# Patient Record
Sex: Female | Born: 1985 | Race: White | Hispanic: No | Marital: Single | Smoking: Former smoker
Health system: Southern US, Community
[De-identification: ages and names within clinical notes are randomized; demographics above are authoritative.]

## PROBLEM LIST (undated history)

## (undated) DIAGNOSIS — N200 Calculus of kidney: Secondary | ICD-10-CM

## (undated) DIAGNOSIS — R569 Unspecified convulsions: Secondary | ICD-10-CM

## (undated) DIAGNOSIS — K219 Gastro-esophageal reflux disease without esophagitis: Secondary | ICD-10-CM

## (undated) DIAGNOSIS — R87619 Unspecified abnormal cytological findings in specimens from cervix uteri: Secondary | ICD-10-CM

## (undated) HISTORY — DX: Unspecified convulsions: R56.9

## (undated) HISTORY — DX: Gastro-esophageal reflux disease without esophagitis: K21.9

## (undated) HISTORY — DX: Calculus of kidney: N20.0

## (undated) HISTORY — PX: CHOLECYSTECTOMY: SHX55

## (undated) HISTORY — DX: Unspecified abnormal cytological findings in specimens from cervix uteri: R87.619

---
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
## 2007-08-13 ENCOUNTER — Observation Stay: Payer: Self-pay | Admitting: Obstetrics and Gynecology

## 2007-08-15 ENCOUNTER — Observation Stay: Payer: Self-pay

## 2007-08-28 HISTORY — PX: TONSILLECTOMY: SUR1361

## 2007-09-22 ENCOUNTER — Inpatient Hospital Stay: Payer: Self-pay

## 2007-11-11 ENCOUNTER — Emergency Department (HOSPITAL_COMMUNITY): Admission: EM | Admit: 2007-11-11 | Discharge: 2007-11-11 | Payer: Self-pay | Admitting: Emergency Medicine

## 2008-01-13 ENCOUNTER — Ambulatory Visit: Payer: Self-pay | Admitting: Unknown Physician Specialty

## 2009-02-02 ENCOUNTER — Emergency Department: Payer: Self-pay | Admitting: Emergency Medicine

## 2011-04-10 ENCOUNTER — Ambulatory Visit: Payer: Self-pay | Admitting: General Surgery

## 2011-05-21 LAB — DIFFERENTIAL
Basophils Relative: 1
Eosinophils Absolute: 0
Lymphocytes Relative: 19
Lymphs Abs: 1.9
Monocytes Relative: 8
Neutro Abs: 7.2

## 2011-05-21 LAB — URINALYSIS, ROUTINE W REFLEX MICROSCOPIC
Bilirubin Urine: NEGATIVE
Glucose, UA: NEGATIVE
Protein, ur: NEGATIVE
Specific Gravity, Urine: 1.01
Urobilinogen, UA: 1
pH: 6

## 2011-05-21 LAB — CBC
HCT: 33.2 — ABNORMAL LOW
MCHC: 33.2
Platelets: 365
RBC: 4.4
WBC: 10

## 2011-05-21 LAB — T4, FREE: Free T4: 4.43 — ABNORMAL HIGH

## 2011-05-21 LAB — COMPREHENSIVE METABOLIC PANEL
ALT: 32
AST: 24
BUN: 10
Calcium: 9.7
Potassium: 3.8
Total Protein: 6.9

## 2011-05-21 LAB — TSH: TSH: 0.01 — ABNORMAL LOW

## 2011-05-21 LAB — POCT PREGNANCY, URINE: Preg Test, Ur: NEGATIVE

## 2011-05-21 LAB — URINE MICROSCOPIC-ADD ON

## 2011-08-28 HISTORY — PX: OTHER SURGICAL HISTORY: SHX169

## 2011-11-20 ENCOUNTER — Emergency Department: Payer: Self-pay | Admitting: Emergency Medicine

## 2011-11-20 LAB — PREGNANCY, URINE: Pregnancy Test, Urine: NEGATIVE m[IU]/mL

## 2011-11-20 LAB — URINALYSIS, COMPLETE
Glucose,UR: NEGATIVE mg/dL (ref 0–75)
Ketone: NEGATIVE
Leukocyte Esterase: NEGATIVE
Nitrite: NEGATIVE
Ph: 5 (ref 4.5–8.0)
RBC,UR: 1 /HPF (ref 0–5)
Specific Gravity: 1.029 (ref 1.003–1.030)

## 2012-08-27 HISTORY — PX: TUBAL LIGATION: SHX77

## 2013-01-14 ENCOUNTER — Inpatient Hospital Stay: Payer: Self-pay | Admitting: Obstetrics and Gynecology

## 2013-01-14 LAB — CBC WITH DIFFERENTIAL/PLATELET
Eosinophil #: 0.1 10*3/uL (ref 0.0–0.7)
Lymphocyte #: 2.1 10*3/uL (ref 1.0–3.6)
Lymphocyte %: 15.3 %
MCH: 31.8 pg (ref 26.0–34.0)
Monocyte #: 0.7 x10 3/mm (ref 0.2–0.9)
Monocyte %: 4.9 %
Neutrophil #: 10.7 10*3/uL — ABNORMAL HIGH (ref 1.4–6.5)
Neutrophil %: 78.8 %
WBC: 13.6 10*3/uL — ABNORMAL HIGH (ref 3.6–11.0)

## 2013-01-15 LAB — HEMATOCRIT: HCT: 31.7 % — ABNORMAL LOW (ref 35.0–47.0)

## 2013-03-10 ENCOUNTER — Ambulatory Visit: Payer: Self-pay | Admitting: Obstetrics and Gynecology

## 2013-03-17 ENCOUNTER — Ambulatory Visit: Payer: Self-pay | Admitting: Obstetrics and Gynecology

## 2013-03-21 ENCOUNTER — Emergency Department: Payer: Self-pay | Admitting: Emergency Medicine

## 2013-03-21 LAB — CBC WITH DIFFERENTIAL/PLATELET
Basophil %: 0.8 %
Eosinophil %: 2.3 %
HCT: 40 % (ref 35.0–47.0)
HGB: 14.1 g/dL (ref 12.0–16.0)
MCHC: 35.4 g/dL (ref 32.0–36.0)
Monocyte #: 0.7 x10 3/mm (ref 0.2–0.9)
Monocyte %: 12.1 %
Neutrophil #: 3 10*3/uL (ref 1.4–6.5)
Neutrophil %: 54 %
Platelet: 244 10*3/uL (ref 150–440)
WBC: 5.5 10*3/uL (ref 3.6–11.0)

## 2013-03-21 LAB — BASIC METABOLIC PANEL
BUN: 18 mg/dL (ref 7–18)
Calcium, Total: 9.9 mg/dL (ref 8.5–10.1)
Creatinine: 0.94 mg/dL (ref 0.60–1.30)
EGFR (African American): >60
Glucose: 92 mg/dL (ref 65–99)
Potassium: 3.8 mmol/L (ref 3.5–5.1)

## 2014-02-02 ENCOUNTER — Emergency Department: Payer: Self-pay | Admitting: Emergency Medicine

## 2014-02-02 LAB — URINALYSIS, COMPLETE
BILIRUBIN, UR: NEGATIVE
BLOOD: NEGATIVE
GLUCOSE, UR: NEGATIVE mg/dL (ref 0–75)
Ketone: NEGATIVE
Leukocyte Esterase: NEGATIVE
NITRITE: NEGATIVE
Ph: 6 (ref 4.5–8.0)
Protein: NEGATIVE
RBC,UR: <1 /HPF (ref 0–5)
Specific Gravity: 1.02 (ref 1.003–1.030)
Squamous Epithelial: 6
WBC UR: 1 /HPF (ref 0–5)

## 2014-02-02 LAB — TROPONIN I: Troponin-I: <0.02 ng/mL

## 2014-02-02 LAB — COMPREHENSIVE METABOLIC PANEL
ALBUMIN: 4.2 g/dL (ref 3.4–5.0)
ALK PHOS: 87 U/L
ALT: 26 U/L (ref 12–78)
ANION GAP: 5 — AB (ref 7–16)
BUN: 14 mg/dL (ref 7–18)
Bilirubin,Total: 1 mg/dL (ref 0.2–1.0)
CALCIUM: 9.4 mg/dL (ref 8.5–10.1)
CO2: 28 mmol/L (ref 21–32)
CREATININE: 0.67 mg/dL (ref 0.60–1.30)
Chloride: 104 mmol/L (ref 98–107)
EGFR (African American): >60
Glucose: 89 mg/dL (ref 65–99)
OSMOLALITY: 274 (ref 275–301)
Potassium: 4 mmol/L (ref 3.5–5.1)
SGOT(AST): 15 U/L (ref 15–37)
Sodium: 137 mmol/L (ref 136–145)
Total Protein: 7.8 g/dL (ref 6.4–8.2)

## 2014-02-02 LAB — CBC WITH DIFFERENTIAL/PLATELET
BASOS ABS: 0.1 10*3/uL (ref 0.0–0.1)
Basophil %: 0.6 %
EOS ABS: 0.1 10*3/uL (ref 0.0–0.7)
Eosinophil %: 1.3 %
HCT: 45.6 % (ref 35.0–47.0)
HGB: 15.4 g/dL (ref 12.0–16.0)
LYMPHS ABS: 2 10*3/uL (ref 1.0–3.6)
Lymphocyte %: 21.5 %
MCH: 30.8 pg (ref 26.0–34.0)
MCHC: 33.8 g/dL (ref 32.0–36.0)
MCV: 91 fL (ref 80–100)
Monocyte #: 0.7 x10 3/mm (ref 0.2–0.9)
Monocyte %: 7.5 %
NEUTROS ABS: 6.5 10*3/uL (ref 1.4–6.5)
Neutrophil %: 69.1 %
Platelet: 233 10*3/uL (ref 150–440)
RBC: 5 10*6/uL (ref 3.80–5.20)
RDW: 12.8 % (ref 11.5–14.5)
WBC: 9.5 10*3/uL (ref 3.6–11.0)

## 2014-02-04 ENCOUNTER — Encounter: Payer: Self-pay | Admitting: *Deleted

## 2014-02-22 ENCOUNTER — Ambulatory Visit (INDEPENDENT_AMBULATORY_CARE_PROVIDER_SITE_OTHER): Payer: Medicaid Other | Admitting: General Surgery

## 2014-02-22 ENCOUNTER — Encounter: Payer: Self-pay | Admitting: General Surgery

## 2014-02-22 VITALS — BP 120/76 | HR 78 | Resp 12 | Ht 61.0 in | Wt 171.0 lb

## 2014-02-22 DIAGNOSIS — K8020 Calculus of gallbladder without cholecystitis without obstruction: Secondary | ICD-10-CM

## 2014-02-22 NOTE — Progress Notes (Signed)
Patient ID: Sara BrooksLinda A Cuevas, female   DOB: 1985-09-13, 28 y.o.   MRN: [id number]  Chief Complaint  Patient presents with  . Other    Gallbladder evaluation    HPI Sara BrooksLinda A Proby is a 28 y.o. female who presents for an evaluation of her gallbladder. She states for approximately 2 months she has been having abdominal pain and nausea. The pain is described as a sharp stabbing pain that starts at the base of her sternum and radiates bilaterally under her ribs. She states the pain is becoming for frequent that lasts for hours at a time. Spicy and greasy foods make it worse. She has a lot of burping. No fever or chills.   HPI  Past Medical History  Diagnosis Date  . GERD (gastroesophageal reflux disease)   . Kidney stone   . Seizures     Past Surgical History  Procedure Laterality Date  . Tonsillectomy  2009  . Lipoma removal  2013  . Tubal ligation  2014    History reviewed. No pertinent family history.  Social History History  Substance Use Topics  . Smoking status: Never Smoker   . Smokeless tobacco: Not on file  . Alcohol Use: Yes    Allergies  Allergen Reactions  . Phenergan [Promethazine Hcl] Nausea And Vomiting    Current Outpatient Prescriptions  Medication Sig Dispense Refill  . venlafaxine (EFFEXOR) 75 MG tablet Take 75 mg by mouth daily.       No current facility-administered medications for this visit.    Review of Systems Review of Systems  Constitutional: Negative.   Respiratory: Negative.   Cardiovascular: Negative.   Gastrointestinal: Positive for nausea and abdominal pain.    Blood pressure 120/76, pulse 78, resp. rate 12, height 5\' 1"  (1.549 m), weight 171 lb (77.565 kg), last menstrual period 02/02/2014.  Physical Exam Physical Exam  Constitutional: She is oriented to person, place, and time. She appears well-developed and well-nourished.  Eyes: Conjunctivae are normal. No scleral icterus.  Neck: Neck supple. No thyromegaly present.   Cardiovascular: Normal rate, regular rhythm and normal heart sounds.   No murmur heard. Pulmonary/Chest: Effort normal and breath sounds normal.  Abdominal: Soft. Normal appearance and bowel sounds are normal. There is no hepatosplenomegaly. There is no tenderness. No hernia.  Lymphadenopathy:    She has no cervical adenopathy.  Neurological: She is alert and oriented to person, place, and time.  Skin: Skin is warm and dry.    Data Reviewed US-gallstones   Assessment    Symptoms likely from biliary colic. Discussed cholecystectomy with pt. Procedure, reasons, risks and benefits discussed. Pt is agreeable.     Plan    Patient to be scheduled for cholecystectomy surgery. Discussed fully with patient. Patient agreeable.     Patient is scheduled for surgery at [facility] on 03/01/14. She will pre admit by phone on 02/24/14. Patient is aware of date and instructions.    SANKAR,SEEPLAPUTHUR G 02/22/2014, 4:13 PM

## 2014-02-22 NOTE — Patient Instructions (Addendum)
Patient to be scheduled for gallbladder surgery. The patient is aware to call back for any questions or concerns.  Laparoscopic Cholecystectomy Laparoscopic cholecystectomy is surgery to remove the gallbladder. The gallbladder is located in the upper right part of the abdomen, behind the liver. It is a storage sac for bile produced in the liver. Bile aids in the digestion and absorption of fats. Cholecystectomy is often done for inflammation of the gallbladder (cholecystitis). This condition is usually caused by a buildup of gallstones (cholelithiasis) in your gallbladder. Gallstones can block the flow of bile, resulting in inflammation and pain. In severe cases, emergency surgery may be required. When emergency surgery is not required, you will have time to prepare for the procedure. Laparoscopic surgery is an alternative to open surgery. Laparoscopic surgery has a shorter recovery time. Your common bile duct may also need to be examined during the procedure. If stones are found in the common bile duct, they may be removed. LET [facility] CARE PROVIDER KNOW ABOUT:  Any allergies you have.  All medicines you are taking, including vitamins, herbs, eye drops, creams, and over-the-counter medicines.  Previous problems you or members of your family have had with the use of anesthetics.  Any blood disorders you have.  Previous surgeries you have had.  Medical conditions you have. RISKS AND COMPLICATIONS Generally, this is a safe procedure. However, as with any procedure, complications can occur. Possible complications include:  Infection.  Damage to the common bile duct, nerves, arteries, veins, or other internal organs such as the stomach, liver, or intestines.  Bleeding.  A stone may remain in the common bile duct.  A bile leak from the cyst duct that is clipped when your gallbladder is removed.  The need to convert to open surgery, which requires a larger incision in the abdomen. This  may be necessary if your surgeon thinks it is not safe to continue with a laparoscopic procedure. BEFORE THE PROCEDURE  Ask your health care provider about changing or stopping any regular medicines. You will need to stop taking aspirin or blood thinners at least 5 days prior to surgery.  Do not eat or drink anything after midnight the night before surgery.  Let your health care provider know if you develop a cold or other infectious problem before surgery. PROCEDURE   You will be given medicine to make you sleep through the procedure (general anesthetic). A breathing tube will be placed in your mouth.  When you are asleep, your surgeon will make several small cuts (incisions) in your abdomen.  A thin, lighted tube with a tiny camera on the end (laparoscope) is inserted through one of the small incisions. The camera on the laparoscope sends a picture to a TV screen in the operating room. This gives the surgeon a good view inside your abdomen.  A gas will be pumped into your abdomen. This expands your abdomen so that the surgeon has more room to perform the surgery.  Other tools needed for the procedure are inserted through the other incisions. The gallbladder is removed through one of the incisions.  After the removal of your gallbladder, the incisions will be closed with stitches, staples, or skin glue. AFTER THE PROCEDURE  You will be taken to a recovery area where your progress will be checked often.  You may be allowed to go home the same day if your pain is controlled and you can tolerate liquids. Document Released: 08/13/2005 Document Revised: 06/03/2013 Document Reviewed: 03/25/2013 Exit[employer] Patient Information  2015 [employer]. This information is not intended to replace advice given to you by your health care provider. Make sure you discuss any questions you have with your health care provider.   Patient is scheduled for surgery at [facility] on 03/01/14. She will pre admit by  phone on 02/24/14. Patient is aware of date and instructions.

## 2014-02-25 ENCOUNTER — Ambulatory Visit: Payer: Self-pay | Admitting: General Surgery

## 2014-02-25 LAB — HEPATIC FUNCTION PANEL A (ARMC)
ALBUMIN: 4 g/dL (ref 3.4–5.0)
AST: 29 U/L (ref 15–37)
Alkaline Phosphatase: 81 U/L
BILIRUBIN TOTAL: 1.1 mg/dL — AB (ref 0.2–1.0)
Bilirubin, Direct: 0.2 mg/dL (ref 0.00–0.20)
SGPT (ALT): 25 U/L (ref 12–78)
TOTAL PROTEIN: 7.4 g/dL (ref 6.4–8.2)

## 2014-03-01 ENCOUNTER — Ambulatory Visit: Payer: Self-pay | Admitting: General Surgery

## 2014-03-01 ENCOUNTER — Encounter: Payer: Self-pay | Admitting: General Surgery

## 2014-03-01 DIAGNOSIS — K8010 Calculus of gallbladder with chronic cholecystitis without obstruction: Secondary | ICD-10-CM

## 2014-03-04 LAB — PATHOLOGY REPORT

## 2014-03-08 ENCOUNTER — Encounter: Payer: Self-pay | Admitting: General Surgery

## 2014-03-15 ENCOUNTER — Ambulatory Visit: Payer: Medicaid Other | Admitting: General Surgery

## 2014-04-20 ENCOUNTER — Encounter: Payer: Self-pay | Admitting: *Deleted

## 2014-06-28 ENCOUNTER — Encounter: Payer: Self-pay | Admitting: General Surgery

## 2014-12-17 NOTE — Op Note (Signed)
PATIENT NAME:  Sara [name], Sara [name] MR#:  [id number] DATE OF BIRTH:  09/08/1985  DATE OF PROCEDURE:  03/17/2013  PREOPERATIVE DIAGNOSES: Desires permanent surgical sterilization.   POSTOPERATIVE DIAGNOSIS: Desires permanent surgical sterilization.   PROCEDURE PERFORMED: Bilateral tubal ligation.   SURGEON: Florina Oundreas M. Bonney AidStaebler, MD  ANESTHESIA:  General.  ESTIMATED BLOOD LOSS: Minimal.   OPERATIVE FLUIDS: 700 mL of crystalloid.   COMPLICATIONS: None.   FINDINGS: Normal uterus, tubes and ovaries. Good 1 cm knuckle of fallopian tube, bilateral, in mid isthmic portion of the tube.   SPECIMENS REMOVED: None.   CONDITION FOLLOWING PROCEDURE: Stable.   PROCEDURE IN DETAIL: Risks, benefits and alternatives of the procedure were discussed with the patient prior to proceeding to the operating room. The patient was placed under general endotracheal anesthesia, positioned in the dorsal lithotomy position using Allen stirrups. [name] Hulka tenaculum was placed to allow movement of the uterus. Attention was then turned to the abdomen. [name] stab incision was made at the base of the umbilicus, and [name] 5 mm XL trocar was used to gain entry into the peritoneal cavity under direct visualization. Pneumoperitoneum was established and the 8 mm Falope ring trocar was placed suprapubically under direct visualization. The right fallopian tube was identified, walked out to the mid isthmic portion.  The Falope ring was then applied noting [name] good 1 cm blanched knuckle.  The left fallopian tube was similarly walked out to the mid isthmic portion after identifying the fimbria and also had [name] Falope ring applied with about [name] 1 cm knuckle of tube within the Falope ring with good blanching noted. At this point, pneumoperitoneum was evacuated. All trocars were then removed. The 8 mm trocar site was closed with [name] 4-0 Monocryl, and the 5 mm trocar site was dressed with Dermabond. Sponge, needle and instrument counts were correct x 2. The Hulka  tenaculum had been removed. The patient tolerated the procedure well and was taken to the recovery room in stable condition.  ____________________________ Florina OuAndreas M. Bonney AidStaebler, MD ams:sb D: 03/23/2013 08:25:59 ET T: 03/23/2013 08:38:45 ET JOB#: [id number]  cc: Florina OuAndreas M. Bonney AidStaebler, MD, <Dictator>   Lorrene ReidANDREAS M Martha Ellerby MD ELECTRONICALLY SIGNED 04/09/2013 20:28

## 2014-12-18 NOTE — Op Note (Signed)
PATIENT NAME:  Sara [name], Sara [name] MR#:  [id number] DATE OF BIRTH:  Jun 27, 1986  DATE OF PROCEDURE:  03/01/2014.  PREOPERATIVE DIAGNOSIS: Chronic cholecystitis and cholelithiasis.   POSTOPERATIVE DIAGNOSIS: Chronic cholecystitis and cholelithiasis.   OPERATION: Laparoscopy, cholecystectomy, cholangiogram.   SURGEON:  Kathreen CosierS. G. Jamichael Knotts, M.D.   ANESTHESIA: General.   COMPLICATIONS: None.   ESTIMATED BLOOD LOSS: Minimal.   DRAINS: None.   DESCRIPTION OF PROCEDURE: The patient was put to sleep in the supine position on the operating table. The abdomen was prepped and draped out as [name] sterile field. Timeout was performed. [name] small incision was made at the umbilicus and [name] Veress needle with the InnerDyne sleeve positioned in the peritoneal cavity was verified with the hanging drop method. Pneumoperitoneum was obtained and [name] 10-mm port was placed. An epigastric and 2 lateral 5-mm ports were then placed. The gallbladder was covered with some fatty tissue that was adherent to it from the adjoining mesocolon and the omentum. The stomach likewise was tented up by an adhesion. With careful exposure these adhesions were taken down with cautery. The area of the Hartman's pouch was identified and the cystic duct required more careful dissection as there was [name] significant amount of [name] firm scar tissue surrounding it.   [name] small anterior branch of the cystic artery was identified. It was Hemoclipped and cut. Subsequently the cystic duct was freed.  [name] Kumar clamp and [name] catheter were positioned, but it appeared that there was very little in the way of any bile that could be aspirated. In view of this, the catheter was removed. The cystic duct was proximally Hemoclipped and an opening made through which some clear bile was seen coming through.  [name] Reddick catheter was then positioned and cholangiogram was performed. However, since the balloon was partially obstructing the proximal portion of the duct it could not be adequately  filled but the distal portion filled easily with no defects and no obstruction to flow. The catheter was removed. The cystic duct was Hemoclipped and cut.   The cystic artery was identified, freed, Hemoclipped and cut. The gallbladder was then dissected free from its bed using cautery for control of bleeding. After ensuring hemostasis, the area was irrigated and all fluid suctioned out below and lateral to the liver. With 5-mm scope in the epigastric port site the gallbladder was placed in [name] retrieval bag and brought out through the umbilical port site. There were multiple stones packing the gallbladder and the stones had to be removed almost in entirely before the gallbladder could be delivered out of the port site.   The umbilical port site was then closed with 0-Vicryl placed with [name] suture passer, and tied. The right upper quadrant once again was inspected and the remaining small amount of fluid was suctioned out from this area. The pneumoperitoneum was relieved and the ports were removed. Skin incisions were closed with subcuticular 4-0 Vicryl, reinforced with Steri-Strips and dry sterile dressing was placed. The patient tolerated the procedure well with no immediate problems encountered. She was subsequently extubated and returned to the recovery room in stable condition.   ____________________________ S.Wynona LunaG. Paizleigh Wilds, MD sgs:lt D: 03/01/2014 11:18:39 ET T: 03/01/2014 12:07:19 ET JOB#: [id number]  cc: S.G. Evette CristalSankar, MD, <Dictator> [facility] Surgery[facility] Wynona LunaG Marquise Wicke MD ELECTRONICALLY SIGNED 03/03/2014 5:43

## 2015-01-04 NOTE — H&P (Signed)
L&D Evaluation:  History:  HPI 29 yo G3P2002 at 2139 weeks 5770w1d by 8w US derived EDD of 01/20/2013 presenting to clinic today for irregular contractions.  Noted to be 2-3cm dilated unchanged from prior visits.  However, during doppler auscultation of the fetal heart tones note was made of an arythmia.  She was therefore sent to L&D for further monitoring and evaluation.  +FM but decreased in last few days per patient, no VB, no LOF.  [facility] unremarkable to date.  PNL O+ / ABSC neg / RI / VZI / RPR NR / HIV neg / HBsAg neg / 1st trimester screen negative / 1-hr 98 / GC & CT neg & neg / GBS neg.  History of IUGR in two prior pregnancies, US at 30 weeks revealed EFW c/w 46%ile.  Fundal heights have been appropriate.   Presents with fetal arythmia   Patient's Medical History No Chronic Illness   Patient's Surgical History Tonislectomy, removal of fatty tumor groin   Medications Pre Natal Vitamins   Allergies phenergan   Social History none   Family History Non-Contributory   ROS:  ROS All systems were reviewed.  HEENT, CNS, GI, GU, Respiratory, CV, Renal and Musculoskeletal systems were found to be normal.   Exam:  Vital Signs stable   Urine Protein not completed   General no apparent distress   Mental Status clear   Chest clear   Heart normal sinus rhythm, no murmur/gallop/rubs   Abdomen gravid, non-tender   Estimated Fetal Weight Average for gestational age   Fetal Position vtx   Edema no edema   Pelvic 2-3cm per clinic check   Mebranes Intact   FHT normal rate with no decels, reactive category I tracing with frequent audible PAC's though resulting in dropped beats.   Ucx irregular   Impression:  Impression 29 yo G3P2002 at 3970w1d with fetal PAC's   Plan:  Plan EFM/NST, monitor contractions and for cervical change   Comments 1) PAC's - case discussed with Dr. Ellin MayhewBrancazio.  Given normal anatomy scan at 20 weeks unlikely to be secondary to  significant underlying cardiac malformation.  Therefore does not feel fetal echocardiogram is indicated.  Given patient at term, favorable cervix, recommended proceeding with IOL.  Will make pediatrics aware.  2) Fetus - Category I fetal heart tracing, despite audible arythmia able to monitor the fetus suffiently to proceed with IOL and vaginal delivery  3) Disposition - pending delivery will likely begin with pitocin and then AROM   Electronic Signatures: Lorrene ReidStaebler, Zakkiyya Barno M (MD)  (Signed 21-May-14 14:57)  Authored: L&D Evaluation   Last Updated: 21-May-14 14:57 by Lorrene ReidStaebler, Radek Carnero M (MD)

## 2015-02-25 ENCOUNTER — Emergency Department: Payer: Medicaid Other

## 2015-02-25 ENCOUNTER — Emergency Department
Admission: EM | Admit: 2015-02-25 | Discharge: 2015-02-25 | Disposition: A | Discharge status: Home / Self Care | Payer: Medicaid Other | Attending: Emergency Medicine | Admitting: Emergency Medicine

## 2015-02-25 ENCOUNTER — Encounter: Payer: Self-pay | Admitting: Emergency Medicine

## 2015-02-25 DIAGNOSIS — M25512 Pain in left shoulder: Secondary | ICD-10-CM

## 2015-02-25 DIAGNOSIS — Z79899 Other long term (current) drug therapy: Secondary | ICD-10-CM | POA: Insufficient documentation

## 2015-02-25 MED ORDER — METHOCARBAMOL 500 MG PO TABS: 500.0000 mg | ORAL_TABLET | Freq: Three times a day (TID) | ORAL | Status: DC

## 2015-02-25 MED ORDER — ETODOLAC 400 MG PO TABS: 400.0000 mg | ORAL_TABLET | Freq: Two times a day (BID) | ORAL | Status: DC

## 2015-02-25 NOTE — ED Notes (Addendum)
D/c instructions reviewed w/ pt - pt denies any further questions or concerns at present.  Pt instructed to not use alcohol, drive, or operate heavy machinery while take the prescription muscle relaxants as they could make him drowsy - pt verbalized understanding.

## 2015-02-25 NOTE — ED Provider Notes (Signed)
[facility] Emergency Department Provider Note  ____________________________________________  Time seen: Approximately 12:14 PM  I have reviewed the triage vital signs and the nursing notes.   HISTORY  Chief Complaint Shoulder Pain   HPI Sara BrooksLinda A [name] is a 29 y.o. female is here complaining of left shoulder pain that started yesterday. She states she did not injure her shoulder but woke up like this. She has decreased range of motion due to pain. She has not taken any medication for this. She states pain is worsened with range of motion and decreased with holding her arm against her body. She rates her pain as a 7 out of 10. Pain is constant and nonradiating.   Past Medical History  Diagnosis Date  . GERD (gastroesophageal reflux disease)   . Kidney stone   . Seizures     There are no active problems to display for this patient.   Past Surgical History  Procedure Laterality Date  . Tonsillectomy  2009  . Lipoma removal  2013  . Tubal ligation  2014  . Cholecystectomy      Current Outpatient Rx  Name  Route  Sig  Dispense  Refill  . etodolac (LODINE) 400 MG tablet   Oral   Take 1 tablet (400 mg total) by mouth 2 (two) times daily.   20 tablet   0   . methocarbamol (ROBAXIN) 500 MG tablet   Oral   Take 1 tablet (500 mg total) by mouth 3 (three) times daily.   12 tablet   0   . venlafaxine (EFFEXOR) 75 MG tablet   Oral   Take 75 mg by mouth daily.           Allergies Phenergan  History reviewed. No pertinent family history.  Social History History  Substance Use Topics  . Smoking status: Never Smoker   . Smokeless tobacco: Not on file  . Alcohol Use: Yes    Review of Systems Constitutional: No fever/chills Eyes: No visual changes. ENT: No sore throat. Cardiovascular: Denies chest pain. Respiratory: Denies shortness of breath. Gastrointestinal: No abdominal pain.  No nausea, no vomiting. Genitourinary: Negative for  dysuria. Musculoskeletal: Negative for back pain. Skin: Negative for rash. Neurological: Negative for headaches, focal weakness or numbness.  10-point ROS otherwise negative.  ____________________________________________   PHYSICAL EXAM:  VITAL SIGNS: ED Triage Vitals  Enc Vitals Group     BP 02/25/15 1128 95/65 mmHg     Pulse Rate 02/25/15 1128 86     Resp --      Temp 02/25/15 1128 98.1 F (36.7 C)     Temp Source 02/25/15 1128 Oral     SpO2 02/25/15 1128 98 %     Weight 02/25/15 1128 180 lb (81.647 kg)     Height 02/25/15 1128 5\' 1"  (1.549 m)     Head Cir --      Peak Flow --      Pain Score 02/25/15 1123 7     Pain Loc --      Pain Edu? --      Excl. in GC? --     Constitutional: Alert and oriented. Well appearing and in no acute distress. Eyes: Conjunctivae are normal. PERRL. EOMI. Head: Atraumatic. Nose: No congestion/rhinnorhea. Neck: No stridor.  No cervical tenderness on palpation Hematological/Lymphatic/Immunilogical: No cervical lymphadenopathy. Cardiovascular: Normal rate, regular rhythm. Grossly normal heart sounds.  Good peripheral circulation. Respiratory: Normal respiratory effort.  No retractions. Lungs CTAB. Gastrointestinal: Soft and nontender.  No distention. Musculoskeletal: No lower extremity tenderness nor edema.  No joint effusions. Left shoulder no gross deformity noted. Moderate tenderness on palpation of the trapezius and before meals joint area. Range of motion is restricted secondary to pain. No crepitus was noted with range of motion. Skin is normal, without abrasion or ecchymosis. Neurologic:  Normal speech and language. No gross focal neurologic deficits are appreciated. Speech is normal. No gait instability. Skin:  Skin is warm, dry and intact. No rash noted. Psychiatric: Mood and affect are normal. Speech and behavior are normal.  ____________________________________________   LABS (all labs ordered are listed, but only abnormal  results are displayed)  Labs Reviewed - No data to display  RADIOLOGY  X-ray per radiologist shows no bony abnormality of the shoulder. I, Tommi Rumps, personally viewed and evaluated these images as part of my medical decision making.  ____________________________________________   PROCEDURES  Procedure(s) performed: None  Critical Care performed: No  ____________________________________________   INITIAL IMPRESSION / ASSESSMENT AND PLAN / ED COURSE  Pertinent labs & imaging results that were available during my care of the patient were reviewed by me and considered in my medical decision making (see chart for detailspatient was placed in a sling for comfort. She is also given a prescription for etodolac and Robaxin. She is encouraged to use ice or heat to the muscles and to follow-up with her PCP as needed.   FINAL CLINICAL IMPRESSION(S) / ED DIAGNOSES  Final diagnoses:  Acute shoulder pain, left      Tommi Rumps, PA-C 02/25/15 1232  Darien Ramus, MD 02/26/15 2256

## 2015-02-25 NOTE — ED Notes (Signed)
Pt to ed with c/o left shoulder pain that started yesterday.  Pt states she does not remember injuring it.  Pt with decreased ROM in left shoulder due to pain.  No bruising, or swelling noted to left shoulder.

## 2015-12-17 ENCOUNTER — Emergency Department (HOSPITAL_COMMUNITY)
Admission: EM | Admit: 2015-12-17 | Discharge: 2015-12-17 | Disposition: A | Discharge status: Home / Self Care | Payer: Medicaid Other | Attending: Emergency Medicine | Admitting: Emergency Medicine

## 2015-12-17 ENCOUNTER — Encounter (HOSPITAL_COMMUNITY): Payer: Self-pay | Admitting: *Deleted

## 2015-12-17 DIAGNOSIS — R000 Tachycardia, unspecified: Secondary | ICD-10-CM | POA: Diagnosis not present

## 2015-12-17 DIAGNOSIS — J209 Acute bronchitis, unspecified: Secondary | ICD-10-CM | POA: Diagnosis not present

## 2015-12-17 DIAGNOSIS — M791 Myalgia: Secondary | ICD-10-CM | POA: Insufficient documentation

## 2015-12-17 DIAGNOSIS — R51 Headache: Secondary | ICD-10-CM | POA: Diagnosis not present

## 2015-12-17 DIAGNOSIS — Z79899 Other long term (current) drug therapy: Secondary | ICD-10-CM | POA: Insufficient documentation

## 2015-12-17 DIAGNOSIS — Z87442 Personal history of urinary calculi: Secondary | ICD-10-CM | POA: Diagnosis not present

## 2015-12-17 DIAGNOSIS — Z8719 Personal history of other diseases of the digestive system: Secondary | ICD-10-CM | POA: Diagnosis not present

## 2015-12-17 DIAGNOSIS — J029 Acute pharyngitis, unspecified: Secondary | ICD-10-CM | POA: Diagnosis present

## 2015-12-17 DIAGNOSIS — J40 Bronchitis, not specified as acute or chronic: Secondary | ICD-10-CM

## 2015-12-17 MED ORDER — BENZONATATE 100 MG PO CAPS: 100.0000 mg | ORAL_CAPSULE | Freq: Three times a day (TID) | ORAL | Status: DC

## 2015-12-17 MED ORDER — AZITHROMYCIN 250 MG PO TABS: ORAL_TABLET | ORAL | Status: DC

## 2015-12-17 MED ORDER — AZITHROMYCIN 250 MG PO TABS
ORAL_TABLET | ORAL | Status: DC
Start: 2015-12-17 — End: 2019-04-16

## 2015-12-17 NOTE — ED Notes (Signed)
Pt reports having a cough, hoarseness, headache, fever since Thursday. Denies n/v/d.

## 2015-12-17 NOTE — ED Provider Notes (Signed)
CSN: [id number]     Arrival date & time 12/17/15  1252 History   First MD Initiated Contact with Patient 12/17/15 1339     Chief Complaint  Patient presents with  . Sore Throat  . URI     (Consider location/radiation/quality/duration/timing/severity/associated sxs/prior Treatment) Patient is a 30 y.o. female presenting with URI.  URI Presenting symptoms: congestion, cough, fever and sore throat   Severity:  Moderate Onset quality:  Gradual Duration:  3 days Timing:  Constant Progression:  Worsening Chronicity:  New Relieved by:  Nothing Worsened by:  Nothing tried Ineffective treatments:  OTC medications Associated symptoms: headaches and myalgias   Risk factors: sick contacts    Sara [name] is a.29 y.o. female who presents to the ED with cough, congestion, fever, chills and sore throat that started 3 days ago. Temp up to 101.5. Productive cough with green sputum.   Past Medical History  Diagnosis Date  . GERD (gastroesophageal reflux disease)   . Kidney stone   . Seizures [facility](HCC)    Past Surgical History  Procedure Laterality Date  . Tonsillectomy  2009  . Lipoma removal  2013  . Tubal ligation  2014  . Cholecystectomy     History reviewed. No pertinent family history. Social History  Substance Use Topics  . Smoking status: Never Smoker   . Smokeless tobacco: None  . Alcohol Use: Yes   OB History    Gravida Para Term Preterm AB TAB SAB Ectopic Multiple Living   3 3        3       Obstetric Comments   1st Menstrual Cycle: 14 1st Pregnancy:  18     Review of Systems  Constitutional: Positive for fever and chills.  HENT: Positive for congestion and sore throat.   Respiratory: Positive for cough.   Musculoskeletal: Positive for myalgias.  Neurological: Positive for headaches.  all other systems negative    Allergies  Phenergan  Home Medications   Prior to Admission medications   Medication Sig Start Date End Date Taking? Authorizing Provider   azithromycin (ZITHROMAX Z-PAK) 250 MG tablet Take 2 tablets today PO and then one tablet daily until finished. 12/17/15   Hope Orlene OchM Neese, NP  benzonatate (TESSALON) 100 MG capsule Take 1 capsule (100 mg total) by mouth every 8 (eight) hours. 12/17/15   Hope Orlene OchM Neese, NP  etodolac (LODINE) 400 MG tablet Take 1 tablet (400 mg total) by mouth 2 (two) times daily. 02/25/15   Tommi Rumpshonda L Summers, PA-C  methocarbamol (ROBAXIN) 500 MG tablet Take 1 tablet (500 mg total) by mouth 3 (three) times daily. 02/25/15   Tommi Rumpshonda L Summers, PA-C  venlafaxine (EFFEXOR) 75 MG tablet Take 75 mg by mouth daily.    Historical Provider, MD   BP 108/70 mmHg  Pulse 107  Temp(Src) 98.6 F (37 C) (Oral)  Resp 16  Wt 81.251 kg  SpO2 100%  LMP 11/14/2015 Physical Exam  Constitutional: She is oriented to person, place, and time. She appears well-developed and well-nourished.  HENT:  Head: Normocephalic.  Right Ear: Tympanic membrane normal.  Left Ear: Tympanic membrane normal.  Nose: Rhinorrhea present.  Mouth/Throat: Uvula is midline and mucous membranes are normal. Posterior oropharyngeal erythema present.  Eyes: Conjunctivae and EOM are normal. Pupils are equal, round, and reactive to light.  Neck: Normal range of motion. Neck supple.  Cardiovascular: Tachycardia present.   Pulmonary/Chest: Effort normal. No accessory muscle usage. No respiratory distress. She has no wheezes. Rhonchi: occasional.  She has no rales.  Abdominal: Soft. There is no tenderness.  Musculoskeletal: Normal range of motion.  Lymphadenopathy:    She has no cervical adenopathy.  Neurological: She is alert and oriented to person, place, and time. No cranial nerve deficit.  Skin: Skin is warm and dry.  Psychiatric: She has a normal mood and affect. Her behavior is normal.  Nursing note and vitals reviewed.   ED Course  Procedures (including critical care time) Labs Review  MDM  30 y.o. female with cough, fever and sore throat stable for d/c  without respiratory distress and O2 SAT100% on R/A. Will treat for bronchitis and she will f/u with her PCP or return here for worsening symptoms. Discussed with the patient and all questioned fully answered.   Final diagnoses:  Bronchitis        Janne Napoleon, NP 12/18/15 1818  Benjiman Core, MD 12/19/15 2329

## 2016-09-16 ENCOUNTER — Encounter (HOSPITAL_COMMUNITY): Payer: Self-pay | Admitting: Emergency Medicine

## 2016-09-16 ENCOUNTER — Emergency Department (HOSPITAL_COMMUNITY): Payer: Self-pay

## 2016-09-16 ENCOUNTER — Emergency Department (HOSPITAL_COMMUNITY)
Admission: EM | Admit: 2016-09-16 | Discharge: 2016-09-16 | Disposition: A | Discharge status: Home / Self Care | Payer: Self-pay | Attending: Emergency Medicine | Admitting: Emergency Medicine

## 2016-09-16 DIAGNOSIS — F17210 Nicotine dependence, cigarettes, uncomplicated: Secondary | ICD-10-CM | POA: Insufficient documentation

## 2016-09-16 DIAGNOSIS — R109 Unspecified abdominal pain: Secondary | ICD-10-CM | POA: Insufficient documentation

## 2016-09-16 LAB — URINALYSIS, ROUTINE W REFLEX MICROSCOPIC
BILIRUBIN URINE: NEGATIVE
GLUCOSE, UA: NEGATIVE mg/dL
HGB URINE DIPSTICK: NEGATIVE
Ketones, ur: NEGATIVE mg/dL
Leukocytes, UA: NEGATIVE
Nitrite: NEGATIVE
Protein, ur: NEGATIVE mg/dL
SPECIFIC GRAVITY, URINE: 1.028 (ref 1.005–1.030)
pH: 5 (ref 5.0–8.0)

## 2016-09-16 LAB — COMPREHENSIVE METABOLIC PANEL
ALBUMIN: 4.5 g/dL (ref 3.5–5.0)
ALK PHOS: 82 U/L (ref 38–126)
ALT: 63 U/L — AB (ref 14–54)
AST: 50 U/L — AB (ref 15–41)
Anion gap: 9 (ref 5–15)
BUN: 14 mg/dL (ref 6–20)
CALCIUM: 9.4 mg/dL (ref 8.9–10.3)
CO2: 22 mmol/L (ref 22–32)
CREATININE: 0.86 mg/dL (ref 0.44–1.00)
Chloride: 108 mmol/L (ref 101–111)
GFR calc Af Amer: >60 mL/min (ref >60)
GFR calc non Af Amer: >60 mL/min (ref >60)
GLUCOSE: 92 mg/dL (ref 65–99)
Potassium: 4.1 mmol/L (ref 3.5–5.1)
SODIUM: 139 mmol/L (ref 135–145)
Total Bilirubin: 1.2 mg/dL (ref 0.3–1.2)
Total Protein: 7.5 g/dL (ref 6.5–8.1)

## 2016-09-16 LAB — WET PREP, GENITAL
CLUE CELLS WET PREP: NONE SEEN
Sperm: NONE SEEN
Trich, Wet Prep: NONE SEEN
Yeast Wet Prep HPF POC: NONE SEEN

## 2016-09-16 LAB — CBC
HCT: 47.2 % — ABNORMAL HIGH (ref 36.0–46.0)
Hemoglobin: 16.9 g/dL — ABNORMAL HIGH (ref 12.0–15.0)
MCH: 31.8 pg (ref 26.0–34.0)
MCHC: 35.8 g/dL (ref 30.0–36.0)
MCV: 88.9 fL (ref 78.0–100.0)
PLATELETS: 246 10*3/uL (ref 150–400)
RBC: 5.31 MIL/uL — ABNORMAL HIGH (ref 3.87–5.11)
RDW: 14 % (ref 11.5–15.5)
WBC: 7.7 10*3/uL (ref 4.0–10.5)

## 2016-09-16 LAB — I-STAT BETA HCG BLOOD, ED (MC, WL, AP ONLY)

## 2016-09-16 LAB — LIPASE, BLOOD: Lipase: 19 U/L (ref 11–51)

## 2016-09-16 NOTE — ED Provider Notes (Signed)
[facility]-EMERGENCY DEPT Provider Note   CSN: [id number] Arrival date & time: 09/16/16  1138     History   Chief Complaint Chief Complaint  Patient presents with  . Abdominal Pain    HPI Sara [name] is a 31 y.o. female.  HPI Patient with right flank pain intermittently for the past several days. It has been coming and going. Sharp in nature and seems to radiate around to her abdomen and also to her left side slightly. Patient has been having intermittent diarrhea. No fever. No vomiting. No pain burning or urgency with urination. No abnormal vaginal discharge or bleeding. Patient has very distant history of a kidney stone when she was 31 years old. She does not remember what that felt like. Patient has already had cholecystectomy. Past Medical History:  Diagnosis Date  . GERD (gastroesophageal reflux disease)   . Kidney stone   . Seizures (HCC)     There are no active problems to display for this patient.   Past Surgical History:  Procedure Laterality Date  . CHOLECYSTECTOMY    . lipoma removal  2013  . TONSILLECTOMY  2009  . TUBAL LIGATION  2014    OB History    Gravida Para Term Preterm AB Living   3 3       3    SAB TAB Ectopic Multiple Live Births                  Obstetric Comments   1st Menstrual Cycle: 14 1st Pregnancy:  18       Home Medications    Prior to Admission medications   Medication Sig Start Date End Date Taking? Authorizing Provider  azithromycin (ZITHROMAX Z-PAK) 250 MG tablet Take 2 tablets today PO and then one tablet daily until finished. 12/17/15   Hope Orlene OchM Neese, NP  benzonatate (TESSALON) 100 MG capsule Take 1 capsule (100 mg total) by mouth every 8 (eight) hours. 12/17/15   Hope Orlene OchM Neese, NP  etodolac (LODINE) 400 MG tablet Take 1 tablet (400 mg total) by mouth 2 (two) times daily. 02/25/15   Tommi Rumpshonda L Summers, PA-C  methocarbamol (ROBAXIN) 500 MG tablet Take 1 tablet (500 mg total) by mouth 3 (three) times daily. 02/25/15   Tommi Rumpshonda L Summers,  PA-C  venlafaxine (EFFEXOR) 75 MG tablet Take 75 mg by mouth daily.    Historical Provider, MD    Family History History reviewed. No pertinent family history.  Social History Social History  Substance Use Topics  . Smoking status: Current Some Day Smoker    Types: Cigarettes  . Smokeless tobacco: Never Used  . Alcohol use Yes     Allergies   Phenergan [promethazine hcl]   Review of Systems Review of Systems 10 Systems reviewed and are negative for acute change except as noted in the HPI.   Physical Exam Updated Vital Signs BP 109/96   Pulse (!) 121   Temp 98.8 F (37.1 C)   Resp 15   Ht 5\' 1"  (1.549 m)   Wt 183 lb (83 kg)   LMP 09/08/2016   SpO2 99%   BMI 34.58 kg/m   Physical Exam  Constitutional: She is oriented to person, place, and time. She appears well-developed and well-nourished. No distress.  HENT:  Head: Normocephalic and atraumatic.  Eyes: Conjunctivae are normal.  Neck: Neck supple.  Cardiovascular: Normal rate and regular rhythm.   No murmur heard. Pulmonary/Chest: Effort normal and breath sounds normal. No respiratory distress.  Abdominal: Soft.  She exhibits no distension. There is no tenderness. There is no guarding.  Genitourinary: Vagina normal.  Genitourinary Comments: Cervix normal without drainage or discharge. No cervical motion tenderness. Adnexa and uterus nontender.  Musculoskeletal: She exhibits no edema, tenderness or deformity.  Neurological: She is alert and oriented to person, place, and time. She exhibits normal muscle tone. Coordination normal.  Skin: Skin is warm and dry.  Psychiatric: She has a normal mood and affect.  Nursing note and vitals reviewed.    ED Treatments / Results  Labs (all labs ordered are listed, but only abnormal results are displayed) Labs Reviewed  COMPREHENSIVE METABOLIC PANEL - Abnormal; Notable for the following:       Result Value   AST 50 (*)    ALT 63 (*)    All other components within  normal limits  CBC - Abnormal; Notable for the following:    RBC 5.31 (*)    Hemoglobin 16.9 (*)    HCT 47.2 (*)    All other components within normal limits  WET PREP, GENITAL  LIPASE, BLOOD  URINALYSIS, ROUTINE W REFLEX MICROSCOPIC  I-STAT BETA HCG BLOOD, ED ([facility], [facility], [facility] ONLY)  GC/CHLAMYDIA PROBE AMP ([place]) NOT AT [facility]    EKG  EKG Interpretation None       Radiology No results found.  Procedures Procedures (including critical care time)  Medications Ordered in ED Medications - No data to display   Initial Impression / Assessment and Plan / ED Course  I have reviewed the triage vital signs and the nursing notes.  Pertinent labs & imaging results that were available during my care of the patient were reviewed by me and considered in my medical decision making (see chart for details).      Final Clinical Impressions(s) / ED Diagnoses   Final diagnoses:  Right flank pain   The patient has had episodic right flank pain. CT scan and urinalysis are pending. Abdominal examination is nonsurgical, pelvic examination does not reveal tenderness, discharge or friability of the cervix. Back exam is will review CT results for possible kidney stone or other etiology. If no stone or other acute finding is present, I feel patient is safe for discharge with symptomatic treatment with either Tylenol or ibuprofen. New Prescriptions New Prescriptions   No medications on file     Arby Barrette, MD 09/16/16 1620

## 2016-09-16 NOTE — ED Triage Notes (Signed)
Pt states she has been having right flank pain that radiates into abd. Pt has hx of kidney stones and infections. Pt also reports n/v.

## 2016-09-16 NOTE — ED Notes (Signed)
Patient transported to CT 

## 2016-09-16 NOTE — ED Provider Notes (Signed)
Patient care signed out to follow-up CT scan and urinalysis. CT scan unremarkable, urinalysis unremarkable. Prior physician recommended outpatient follow-up.  Results and differential diagnosis were discussed with the patient/parent/guardian. Xrays were independently reviewed by myself.  Close follow up outpatient was discussed, comfortable with the plan.  Ct Renal Stone Study  Result Date: 09/16/2016 CLINICAL DATA:  Evaluate for stones.  Right flank pain. EXAM: CT ABDOMEN AND PELVIS WITHOUT CONTRAST TECHNIQUE: Multidetector CT imaging of the abdomen and pelvis was performed following the standard protocol without IV contrast. COMPARISON:  None. FINDINGS: Lower chest: No acute abnormality. Hepatobiliary: Patient is status post cholecystectomy. The liver is otherwise normal. Pancreas: Unremarkable. No pancreatic ductal dilatation or surrounding inflammatory changes. Spleen: Normal in size without focal abnormality. Adrenals/Urinary Tract: Adrenal glands are unremarkable. Kidneys are normal, without renal calculi, focal lesion, or hydronephrosis. Bladder is unremarkable. Stomach/Bowel: Stomach is within normal limits. Appendix appears normal. No evidence of bowel wall thickening, distention, or inflammatory changes. Vascular/Lymphatic: No significant vascular findings are present. No enlarged abdominal or pelvic lymph nodes. Reproductive: Uterus and bilateral adnexa are unremarkable. Other: No abdominal wall hernia or abnormality. No abdominopelvic ascites. Musculoskeletal: No acute or significant osseous findings. IMPRESSION: 1. No abnormalities to explain the patient's flank pain. The appendix is normal in appearance with no evidence of appendicitis. No renal stones or obstruction. Electronically Signed   By: Gerome Samavid  Williams III M.D   On: 09/16/2016 17:11    Medications - No data to display  Vitals:   09/16/16 1412 09/16/16 1430 09/16/16 1500 09/16/16 1646  BP:  109/96 105/76 107/69  Pulse: 110 (!)  121 101 116  Resp:    16  Temp:      SpO2: 99% 99% 98% 97%  Weight:      Height:        Final diagnoses:  Right flank pain      Blane OharaJoshua Pat Elicker, MD 09/16/16 1728

## 2016-09-16 NOTE — Discharge Instructions (Signed)
If you were given medicines take as directed.  If you are on coumadin or contraceptives realize their levels and effectiveness is altered by many different medicines.  If you have any reaction (rash, tongues swelling, other) to the medicines stop taking and see a physician.    If your blood pressure was elevated in the ER make sure you follow up for management with a primary doctor or return for chest pain, shortness of breath or stroke symptoms.  Please follow up as directed and return to the ER or see a physician for new or worsening symptoms.  Thank you. Vitals:   09/16/16 1412 09/16/16 1430 09/16/16 1500 09/16/16 1646  BP:  109/96 105/76 107/69  Pulse: 110 (!) 121 101 116  Resp:    16  Temp:      SpO2: 99% 99% 98% 97%  Weight:      Height:

## 2016-09-17 LAB — GC/CHLAMYDIA PROBE AMP (~~LOC~~) NOT AT ARMC
Chlamydia: NEGATIVE
NEISSERIA GONORRHEA: NEGATIVE

## 2018-04-05 IMAGING — CT CT RENAL STONE PROTOCOL
2 of 4 series · 16 of 46 positions shown, 18 images · non-contrast
Comparison: None.

CLINICAL DATA: Evaluate for stones.  Right flank pain.

EXAM:
CT ABDOMEN AND PELVIS WITHOUT CONTRAST
TECHNIQUE: Multidetector CT imaging of the abdomen and pelvis was performed
following the standard protocol without IV contrast.

[Series 2: renal stone 5.0 · axial · 0.87mm/px · z∈[+739,+1219]mm · 13 of 106 slices shown, 15 images]
[im 5/106  soft-tissue]
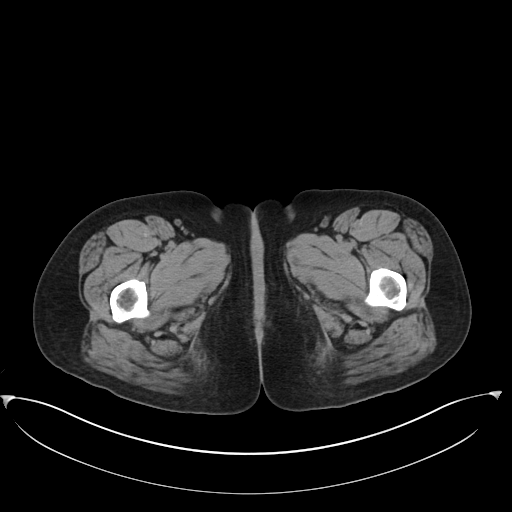
[im 5/106  bone]
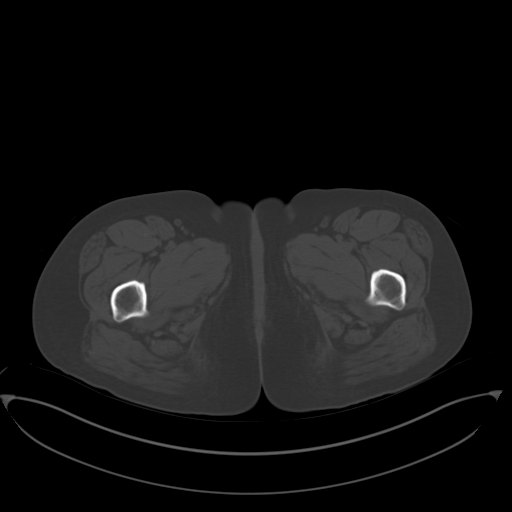
[im 13/106  soft-tissue]
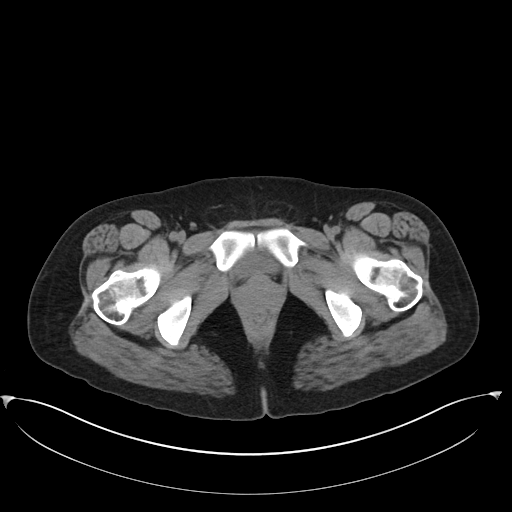
[im 21/106  soft-tissue]
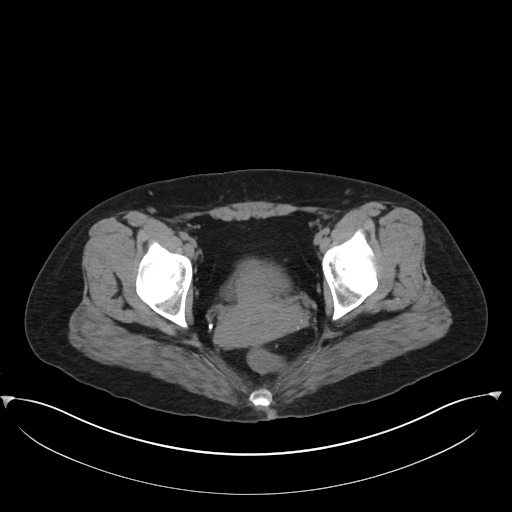
[im 29/106  soft-tissue]
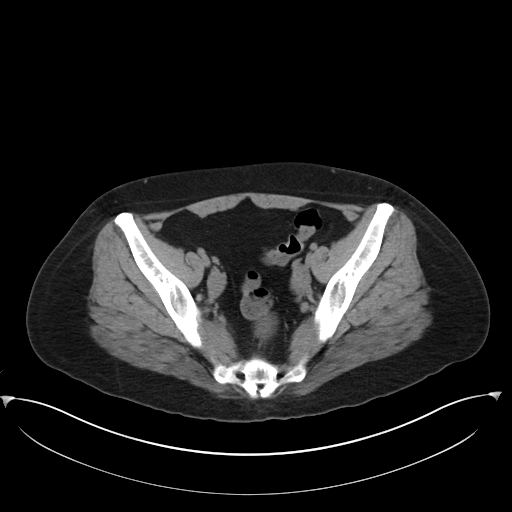
[im 37/106  soft-tissue]
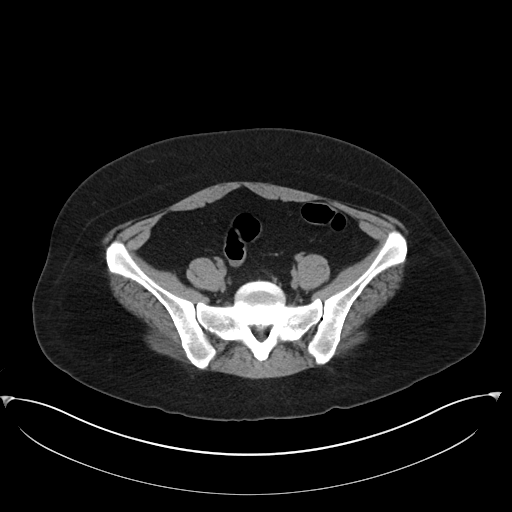
[im 45/106  soft-tissue]
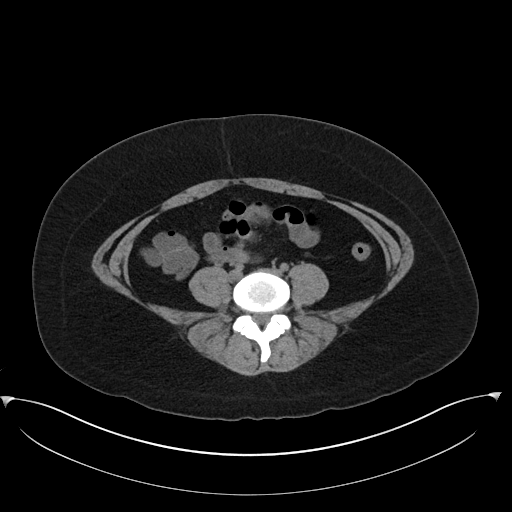
[im 53/106  soft-tissue]
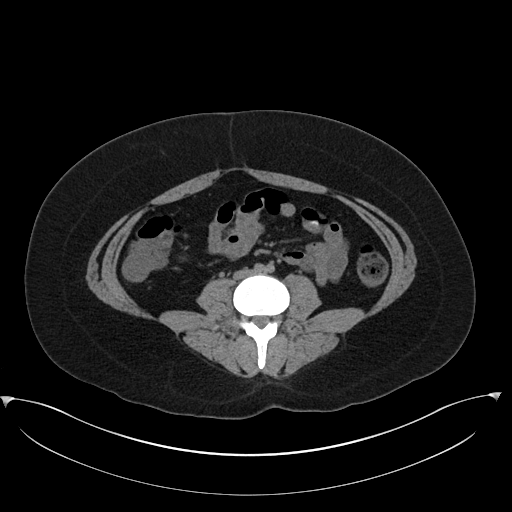
[im 61/106  soft-tissue]
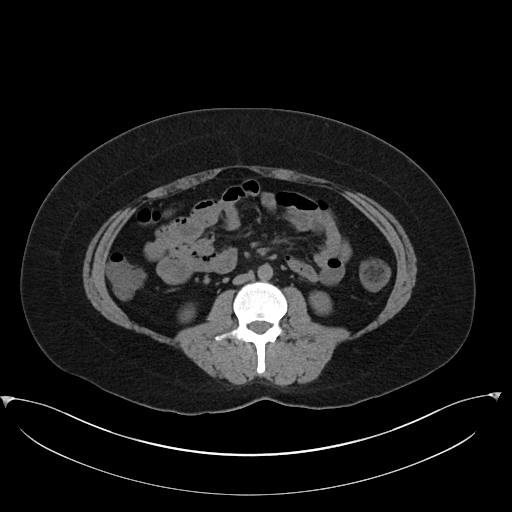
[im 69/106  soft-tissue]
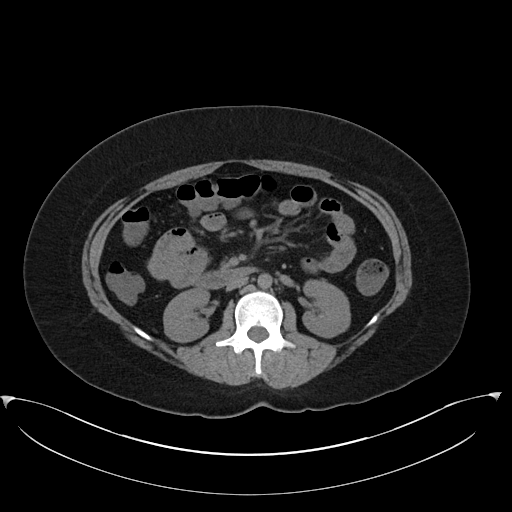
[im 69/106  bone]
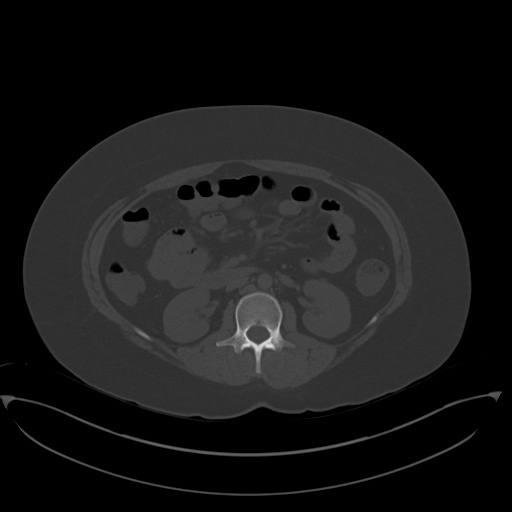
[im 77/106  soft-tissue]
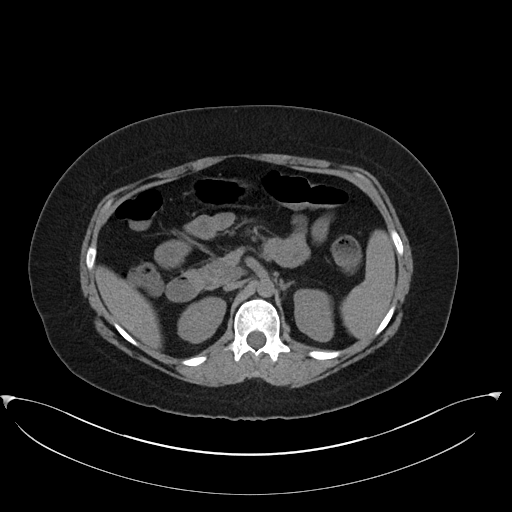
[im 85/106  soft-tissue]
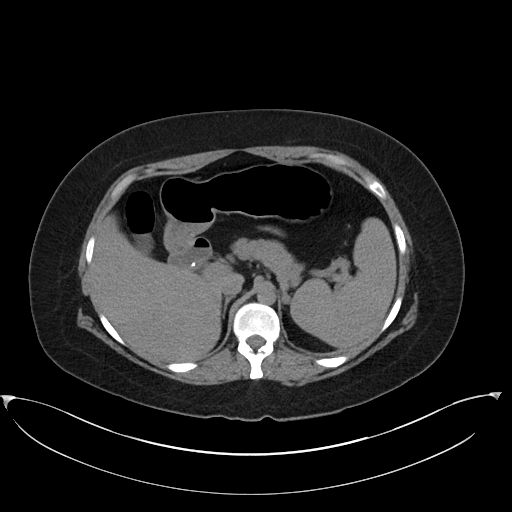
[im 93/106  soft-tissue]
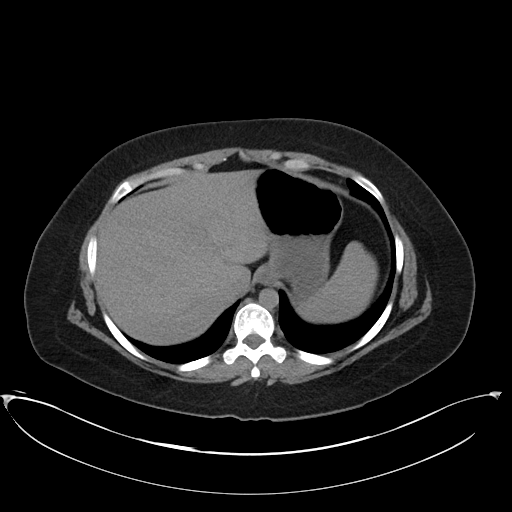
[im 101/106  soft-tissue]
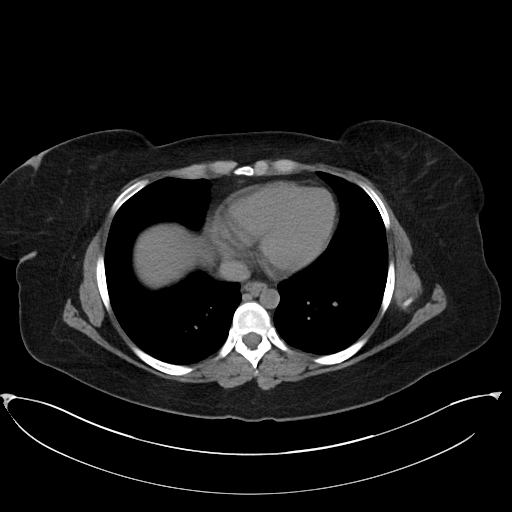

[Series 3: renal stone 3.0 cor · coronal · 0.83mm/px · 3 of 96 slices shown]
[im 32/96  soft-tissue]
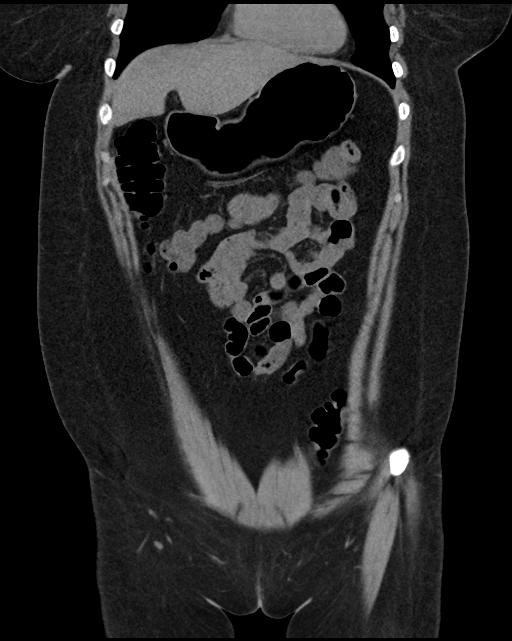
[im 43/96  soft-tissue]
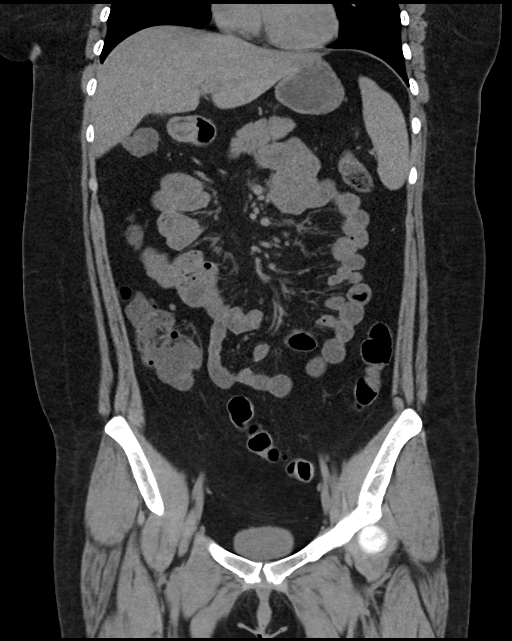
[im 53/96  soft-tissue]
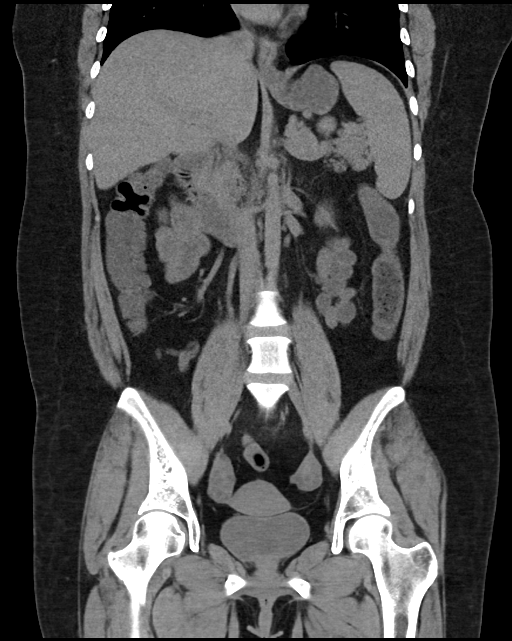

[16 of 46 positions shown; findings below may reference images not displayed]

FINDINGS: Lower chest: No acute abnormality.

Hepatobiliary: Patient is status post cholecystectomy. The liver is
otherwise normal.

Pancreas: Unremarkable. No pancreatic ductal dilatation or
surrounding inflammatory changes.

Spleen: Normal in size without focal abnormality.

Adrenals/Urinary Tract: Adrenal glands are unremarkable. Kidneys are
normal, without renal calculi, focal lesion, or hydronephrosis.
Bladder is unremarkable.

Stomach/Bowel: Stomach is within normal limits. Appendix appears
normal. No evidence of bowel wall thickening, distention, or
inflammatory changes.

Vascular/Lymphatic: No significant vascular findings are present. No
enlarged abdominal or pelvic lymph nodes.

Reproductive: Uterus and bilateral adnexa are unremarkable.

Other: No abdominal wall hernia or abnormality. No abdominopelvic
ascites.

Musculoskeletal: No acute or significant osseous findings.
IMPRESSION: 1. No abnormalities to explain the patient's flank pain. The
appendix is normal in appearance with no evidence of appendicitis.
No renal stones or obstruction.

## 2019-03-27 ENCOUNTER — Telehealth: Payer: Self-pay | Admitting: Obstetrics & Gynecology

## 2019-03-27 NOTE — Telephone Encounter (Signed)
[facility] referring for Atypical Squamous. Called and left voicemail for patient to call back to be schedule

## 2019-04-13 ENCOUNTER — Encounter: Payer: Self-pay | Admitting: Obstetrics and Gynecology

## 2019-04-13 ENCOUNTER — Ambulatory Visit (INDEPENDENT_AMBULATORY_CARE_PROVIDER_SITE_OTHER): Payer: 59 | Admitting: Obstetrics and Gynecology

## 2019-04-13 ENCOUNTER — Other Ambulatory Visit: Payer: Self-pay

## 2019-04-13 VITALS — BP 124/78 | Ht 61.0 in | Wt 135.0 lb

## 2019-04-13 DIAGNOSIS — R87610 Atypical squamous cells of undetermined significance on cytologic smear of cervix (ASC-US): Secondary | ICD-10-CM | POA: Diagnosis not present

## 2019-04-13 NOTE — Progress Notes (Signed)
Obstetrics & Gynecology Office Visit   Chief Complaint:  Chief Complaint  Patient presents with  . Referral    Abnormal pap smear  Referral from  family practice, Dr. Lorelee Market, for an abnormal pap smear  History of Present Illness: 33 y.o. [id number] female who presents in referral from [facility], Dr. Lorelee Market, for an abnormal pap smear.  On March 17, 2019, the patient had a Pap smear that showed atypical squamous cells of undetermined significance.  The HPV result was negative.  She states that she had an abnormal Pap smear when she was 33 years old.  Since that time, she does not recall having an abnormal Pap smear.  She otherwise has no acute issues today and presents only for management recommendations of the above.   Past Medical History:  Diagnosis Date  . Abnormal Pap smear of cervix   . GERD (gastroesophageal reflux disease)   . Kidney stone   . Seizures ([place])     Past Surgical History:  Procedure Laterality Date  . CHOLECYSTECTOMY    . lipoma removal  2013  . TONSILLECTOMY  2009  . TUBAL LIGATION  2014    Gynecologic History: Patient's last menstrual period was 03/30/2019.  Obstetric History: [name]  Family History: denies history of gynecologic cancer   Social History   Socioeconomic History  . Marital status: Single    Spouse name: Not on file  . Number of children: Not on file  . Years of education: Not on file  . Highest education level: Not on file  Occupational History  . Not on file  Social Needs  . Financial resource strain: Not on file  . Food insecurity    Worry: Not on file    Inability: Not on file  . Transportation needs    Medical: Not on file    Non-medical: Not on file  Tobacco Use  . Smoking status: Current Some Day Smoker    Types: Cigarettes  . Smokeless tobacco: Never Used  Substance and Sexual Activity  . Alcohol use: Not Currently  . Drug use: No  . Sexual activity: Yes    Birth  control/protection: Surgical  Lifestyle  . Physical activity    Days per week: Not on file    Minutes per session: Not on file  . Stress: Not on file  Relationships  . Social Herbalist on phone: Not on file    Gets together: Not on file    Attends religious service: Not on file    Active member of club or organization: Not on file    Attends meetings of clubs or organizations: Not on file    Relationship status: Not on file  . Intimate partner violence    Fear of current or ex partner: Not on file    Emotionally abused: Not on file    Physically abused: Not on file    Forced sexual activity: Not on file  Other Topics Concern  . Not on file  Social History Narrative  . Not on file    Allergies  Allergen Reactions  . Phenergan [Promethazine Hcl] Nausea And Vomiting    Prior to Admission medications: denies     Review of Systems  Constitutional: Negative.   HENT: Negative.   Eyes: Negative.   Respiratory: Negative.   Cardiovascular: Negative.   Gastrointestinal: Negative.   Genitourinary: Negative.   Musculoskeletal: Negative.   Skin: Negative.   Neurological: Negative.   Psychiatric/Behavioral: Negative.  Physical Exam BP 124/78   Ht 5\' 1"  (1.549 m)   Wt 135 lb (61.2 kg)   LMP 03/30/2019   BMI 25.51 kg/m  Patient's last menstrual period was 03/30/2019. Physical Exam Constitutional:      General: She is not in acute distress.    Appearance: Normal appearance.  HENT:     Head: Normocephalic and atraumatic.  Eyes:     General: No scleral icterus.    Conjunctiva/sclera: Conjunctivae normal.  Neurological:     General: No focal deficit present.     Mental Status: She is alert and oriented to person, place, and time.     Cranial Nerves: No cranial nerve deficit.  Psychiatric:        Mood and Affect: Mood normal.        Behavior: Behavior normal.        Judgment: Judgment normal.     Assessment: 33 y.o. 153P3003 female here for  1. ASCUS  of cervix with negative high risk HPV      Plan: Problem List Items Addressed This Visit    None    Visit Diagnoses    ASCUS of cervix with negative high risk HPV    -  Primary     Based on the latest guidelines from the ASCCP, the recommended follow-up for this Pap smear result is to repeat a Pap smear with cotesting in 3 years.  It is been my personal practice that patients feel uneasy after having an abnormal Pap smear and will frequently request a Pap smear after only 1 year.  I usually leave this to the discretion of the patient for reassurance.  We did discuss that it would not be necessary.  However, this would not be unreasonable either.  All questions were answered and we discussed the significance of the various components of Pap smear testing in detail so that the patient would have a better understanding of her results.  Thank you for this referral.  If you have any questions or need any guidance for follow-up, please do not hesitate to ask.  20 minutes spent in face to face discussion with > 50% spent in counseling,management, and coordination of care of her ASCUS of cervix with negative high risk HPV.   Thomasene MohairStephen Vasilia Dise, MD 04/13/2019 2:16 PM     CC: Evelene CroonNiemeyer, [place], MD [address] [place],  [place] [id number]

## 2019-04-15 ENCOUNTER — Telehealth: Payer: 59 | Admitting: Nurse Practitioner

## 2019-04-15 DIAGNOSIS — B373 Candidiasis of vulva and vagina: Secondary | ICD-10-CM

## 2019-04-15 DIAGNOSIS — B3731 Acute candidiasis of vulva and vagina: Secondary | ICD-10-CM

## 2019-04-15 MED ORDER — FLUCONAZOLE 150 MG PO TABS: 150.0000 mg | ORAL_TABLET | Freq: Once | ORAL | 0 refills | Status: AC

## 2019-04-15 NOTE — Progress Notes (Signed)

## 2019-04-16 ENCOUNTER — Telehealth: Payer: Self-pay | Admitting: Physician Assistant

## 2019-04-16 DIAGNOSIS — R300 Dysuria: Secondary | ICD-10-CM

## 2019-04-16 MED ORDER — CEPHALEXIN 500 MG PO CAPS: 500.0000 mg | ORAL_CAPSULE | Freq: Two times a day (BID) | ORAL | 0 refills | Status: AC

## 2019-04-16 NOTE — Progress Notes (Signed)
I have spent 5 minutes in review of e-visit questionnaire, review and updating patient chart, medical decision making and response to patient.   Terrah Decoster Cody Juanya Villavicencio, PA-C    

## 2019-04-16 NOTE — Progress Notes (Signed)

## 2019-06-15 ENCOUNTER — Ambulatory Visit: Payer: 59 | Admitting: Obstetrics and Gynecology

## 2019-06-15 ENCOUNTER — Encounter: Payer: Self-pay | Admitting: Obstetrics and Gynecology

## 2019-06-15 ENCOUNTER — Telehealth: Payer: 59 | Admitting: Family

## 2019-06-15 ENCOUNTER — Ambulatory Visit (INDEPENDENT_AMBULATORY_CARE_PROVIDER_SITE_OTHER): Payer: 59 | Admitting: Obstetrics and Gynecology

## 2019-06-15 ENCOUNTER — Other Ambulatory Visit: Payer: Self-pay

## 2019-06-15 VITALS — BP 100/68 | Ht 61.0 in | Wt 138.0 lb

## 2019-06-15 DIAGNOSIS — N898 Other specified noninflammatory disorders of vagina: Secondary | ICD-10-CM

## 2019-06-15 DIAGNOSIS — R300 Dysuria: Secondary | ICD-10-CM

## 2019-06-15 DIAGNOSIS — N3001 Acute cystitis with hematuria: Secondary | ICD-10-CM | POA: Diagnosis not present

## 2019-06-15 LAB — POCT URINALYSIS DIPSTICK
Bilirubin, UA: NEGATIVE
Blood, UA: NEGATIVE
Glucose, UA: NEGATIVE
Ketones, UA: NEGATIVE
Nitrite, UA: NEGATIVE
Protein, UA: NEGATIVE
Spec Grav, UA: 1.025 (ref 1.010–1.025)
pH, UA: 5 (ref 5.0–8.0)

## 2019-06-15 MED ORDER — NITROFURANTOIN MONOHYD MACRO 100 MG PO CAPS: 100.0000 mg | ORAL_CAPSULE | Freq: Two times a day (BID) | ORAL | 0 refills | Status: AC

## 2019-06-15 NOTE — Patient Instructions (Signed)
I value your feedback and entrusting us with your care. If you get a [facility] patient survey, I would appreciate you taking the time to let us know about your experience today. Thank you! 

## 2019-06-15 NOTE — Progress Notes (Signed)
Based on what you shared with me, I feel your condition warrants further evaluation and I recommend that you be seen for a face to face office visit.  Given your symptoms of a UTI with discharge you need to be seen face to face to rule out other serious problems.   NOTE: If you entered your credit card information for this eVisit, you will not be charged. You may see a "hold" on your card for the $35 but that hold will drop off and you will not have a charge processed.  If you are having a true medical emergency please call 911.     For an urgent face to face visit, [place] has four urgent care centers for your convenience:   . [facility]    [phone]                  Get Driving Directions  [address], [place] [address] . 10 am to 8 pm Monday-Friday . 12 pm to 8 pm Saturday-Sunday   . [facility] at [place]                  Get Driving Directions  [address], [place], [place] [address] . 8 am to 8 pm Monday-Friday . 9 am to 6 pm Saturday . 11 am to 6 pm Sunday   . [facility] at [place]                  Get Driving Directions   [address] [place], [place] [address] . 8 am to 8 pm Monday-Friday . 8 am to 4 pm Saturday-Sunday    . [facility] at [place]                    Get Driving Directions  [phone]  [address] [place],  [address]  . Monday-Friday, 12 PM to 6 PM    Your e-visit answers were reviewed by a board certified advanced clinical practitioner to complete your personal care plan.  Thank you for using e-Visits.

## 2019-06-15 NOTE — Progress Notes (Signed)
Sara Market, MD   Chief Complaint  Patient presents with  . Urinary Tract Infection    frequent urination, discomfort when urinating, blood in urine, back pain x since Sat    HPI:      Ms. Sara [name] is a 33 y.o. [id number] who LMP was Patient's last menstrual period was 05/29/2019 (exact date)., presents today for UTI sx of urinary frequency/urgency/dysuria/hematuria for several days. Has had LBP, no pelvic pain/fevers. Hx of UTIs in past. No vag sx, no vag bleeding.    Past Medical History:  Diagnosis Date  . Abnormal Pap smear of cervix   . GERD (gastroesophageal reflux disease)   . Kidney stone   . Seizures ([place])     Past Surgical History:  Procedure Laterality Date  . CHOLECYSTECTOMY    . lipoma removal  2013  . TONSILLECTOMY  2009  . TUBAL LIGATION  2014    History reviewed. No pertinent family history.  Social History   Socioeconomic History  . Marital status: Single    Spouse name: Not on file  . Number of children: Not on file  . Years of education: Not on file  . Highest education level: Not on file  Occupational History  . Not on file  Social Needs  . Financial resource strain: Not on file  . Food insecurity    Worry: Not on file    Inability: Not on file  . Transportation needs    Medical: Not on file    Non-medical: Not on file  Tobacco Use  . Smoking status: Current Some Day Smoker    Types: Cigarettes  . Smokeless tobacco: Never Used  Substance and Sexual Activity  . Alcohol use: Not Currently  . Drug use: No  . Sexual activity: Yes    Birth control/protection: Surgical    Comment: Tubal Ligation  Lifestyle  . Physical activity    Days per week: Not on file    Minutes per session: Not on file  . Stress: Not on file  Relationships  . Social Herbalist on phone: Not on file    Gets together: Not on file    Attends religious service: Not on file    Active member of club or organization: Not on file    Attends  meetings of clubs or organizations: Not on file    Relationship status: Not on file  . Intimate partner violence    Fear of current or ex partner: Not on file    Emotionally abused: Not on file    Physically abused: Not on file    Forced sexual activity: Not on file  Other Topics Concern  . Not on file  Social History Narrative  . Not on file    No outpatient medications prior to visit.   No facility-administered medications prior to visit.       ROS:  Review of Systems  Constitutional: Negative for fever.  Gastrointestinal: Negative for blood in stool, constipation, diarrhea, nausea and vomiting.  Genitourinary: Positive for dysuria, frequency and urgency. Negative for dyspareunia, flank pain, hematuria, vaginal bleeding, vaginal discharge and vaginal pain.  Musculoskeletal: Positive for back pain.  Skin: Negative for rash.  BREAST: No symptoms   OBJECTIVE:   Vitals:  BP 100/68   Ht 5\' 1"  (1.549 m)   Wt 138 lb (62.6 kg)   LMP 05/29/2019 (Exact Date)   BMI 26.07 kg/m   Physical Exam Vitals signs reviewed.  Constitutional:  Appearance: She is well-developed. She is not ill-appearing or toxic-appearing.  Neck:     Musculoskeletal: Normal range of motion.  Pulmonary:     Effort: Pulmonary effort is normal.  Abdominal:     Tenderness: There is no right CVA tenderness or left CVA tenderness.  Musculoskeletal: Normal range of motion.  Neurological:     General: No focal deficit present.     Mental Status: She is alert and oriented to person, place, and time.     Cranial Nerves: No cranial nerve deficit.  Psychiatric:        Behavior: Behavior normal.        Thought Content: Thought content normal.        Judgment: Judgment normal.     Results: Results for orders placed or performed in visit on 06/15/19 (from the past 24 hour(s))  POCT Urinalysis Dipstick     Status: Abnormal   Collection Time: 06/15/19 11:02 AM  Result Value Ref Range   Color, UA  yellow    Clarity, UA cloudy    Glucose, UA Negative Negative   Bilirubin, UA neg    Ketones, UA neg    Spec Grav, UA 1.025 1.010 - 1.025   Blood, UA neg    pH, UA 5.0 5.0 - 8.0   Protein, UA Negative Negative   Urobilinogen, UA     Nitrite, UA neg    Leukocytes, UA Small (1+) (A) Negative   Appearance     Odor       Assessment/Plan: Acute cystitis with hematuria - Plan: nitrofurantoin, macrocrystal-monohydrate, (MACROBID) 100 MG capsule, POCT Urinalysis Dipstick, Urine Culture; Pos sx/UA. Rx macrobid. Check C&S. Fu prn.    Meds ordered this encounter  Medications  . nitrofurantoin, macrocrystal-monohydrate, (MACROBID) 100 MG capsule    Sig: Take 1 capsule (100 mg total) by mouth 2 (two) times daily for 5 days.    Dispense:  10 capsule    Refill:  0    Order Specific Question:   Supervising Provider    Answer:   Nadara Mustard [[id number]]      Return if symptoms worsen or fail to improve.  Alicia B. Copland, PA-C 06/15/2019 11:03 AM

## 2019-06-15 NOTE — Progress Notes (Signed)
See previous Evisit 

## 2019-06-17 LAB — URINE CULTURE

## 2019-06-17 NOTE — Progress Notes (Signed)
Pls let pt know C&S showed UTI. Sometimes responds to macrobid but if still sx, we need to change abx. Pls f/u with pt. Thx.

## 2019-06-18 NOTE — Progress Notes (Signed)
Pt aware. Says she is feeling pretty good now, and will follow up as needed.

## 2019-06-30 ENCOUNTER — Telehealth: Payer: 59 | Admitting: Physician Assistant

## 2019-06-30 DIAGNOSIS — L709 Acne, unspecified: Secondary | ICD-10-CM | POA: Diagnosis not present

## 2019-06-30 MED ORDER — DOXYCYCLINE HYCLATE 100 MG PO TABS: 100.0000 mg | ORAL_TABLET | Freq: Two times a day (BID) | ORAL | 0 refills | Status: DC

## 2019-06-30 MED ORDER — CLINDAMYCIN PHOSPHATE 1 % EX GEL: Freq: Two times a day (BID) | CUTANEOUS | 0 refills | Status: DC

## 2019-06-30 NOTE — Addendum Note (Signed)
Addended by: Chevis Pretty on: 06/30/2019 05:24 PM   Modules accepted: Orders

## 2019-06-30 NOTE — Addendum Note (Signed)
Addended by: Chevis Pretty on: 06/30/2019 04:42 PM   Modules accepted: Orders

## 2019-06-30 NOTE — Progress Notes (Signed)
E-Visit 10 min.  Patient is a 33 year old female who is having problems with acne in the face and scalp.  The patient has tried over-the-counter acne washes, but these have not been successful.  The patient is having some swelling of her face with some redness present.  There is been no high fever reported.  No excessive nausea or vomiting reported.  The patient is asked to continue with the over-the-counter acne washes.  Prescription for doxycycline has been ordered.

## 2019-07-08 ENCOUNTER — Telehealth: Payer: 59 | Admitting: Nurse Practitioner

## 2019-07-08 DIAGNOSIS — B373 Candidiasis of vulva and vagina: Secondary | ICD-10-CM

## 2019-07-08 DIAGNOSIS — R21 Rash and other nonspecific skin eruption: Secondary | ICD-10-CM

## 2019-07-08 DIAGNOSIS — B3731 Acute candidiasis of vulva and vagina: Secondary | ICD-10-CM

## 2019-07-08 MED ORDER — FLUCONAZOLE 150 MG PO TABS: 150.0000 mg | ORAL_TABLET | Freq: Once | ORAL | 0 refills | Status: AC

## 2019-07-08 NOTE — Progress Notes (Signed)
We are sorry that you are not feeling well. Here is how we plan to help! Based on what you shared with me it looks like you: May have a yeast vaginosis  Vaginosis is an inflammation of the vagina that can result in discharge, itching and pain. The cause is usually a change in the normal balance of vaginal bacteria or an infection. Vaginosis can also result from reduced estrogen levels after menopause.  The most common causes of vaginosis are:   Bacterial vaginosis which results from an overgrowth of one on several organisms that are normally present in your vagina.   Yeast infections which are caused by a naturally occurring fungus called candida.   Vaginal atrophy (atrophic vaginosis) which results from the thinning of the vagina from reduced estrogen levels after menopause.   Trichomoniasis which is caused by a parasite and is commonly transmitted by sexual intercourse.  Factors that increase your risk of developing vaginosis include: [place] Kitchen Medications, such as antibiotics and steroids . Uncontrolled diabetes . Use of hygiene products such as bubble bath, vaginal spray or vaginal deodorant . Douching . Wearing damp or tight-fitting clothing . Using an intrauterine device (IUD) for birth control . Hormonal changes, such as those associated with pregnancy, birth control pills or menopause . Sexual activity . Having a sexually transmitted infection  Your treatment plan is A single Diflucan (fluconazole) 150mg  tablet once.  I have electronically sent this prescription into the pharmacy that you have chosen. monistat OTC for the outside itching  Be sure to take all of the medication as directed. Stop taking any medication if you develop a rash, tongue swelling or shortness of breath. Mothers who are breast feeding should consider pumping and discarding their breast milk while on these antibiotics. However, there is no consensus that infant exposure at these doses would be harmful.  Remember  that medication creams can weaken latex condoms. . *according to your questionnaire , you do not have a urinary track  Infection.   HOME CARE:  Good hygiene may prevent some types of vaginosis from recurring and may relieve some symptoms:  . Avoid baths, hot tubs and whirlpool spas. Rinse soap from your outer genital area after a shower, and dry the area well to prevent irritation. Don't use scented or harsh soaps, such as those with deodorant or antibacterial action. Avoid irritants. These include scented tampons and pads. . Wipe from front to back after using the toilet. Doing so avoids spreading fecal bacteria to your vagina.  Other things that may help prevent vaginosis include:  Marland Kitchen Don't douche. Your vagina doesn't require cleansing other than normal bathing. Repetitive douching disrupts the normal organisms that reside in the vagina and can actually increase your risk of vaginal infection. Douching won't clear up a vaginal infection. . Use a latex condom. Both female and female latex condoms may help you avoid infections spread by sexual contact. . Wear cotton underwear. Also wear pantyhose with a cotton crotch. If you feel comfortable without it, skip wearing underwear to bed. Yeast thrives in [place] Kitchen Your symptoms should improve in the next day or two.  GET HELP RIGHT AWAY IF:  . You have pain in your lower abdomen ( pelvic area or over your ovaries) . You develop nausea or vomiting . You develop a fever . Your discharge changes or worsens . You have persistent pain with intercourse . You develop shortness of breath, a rapid pulse, or you faint.  These symptoms could be signs  of problems or infections that need to be evaluated by a medical provider now.  MAKE SURE YOU    Understand these instructions.  Will watch your condition.  Will get help right away if you are not doing well or get worse.  Your e-visit answers were reviewed by a board certified advanced  clinical practitioner to complete your personal care plan. Depending upon the condition, your plan could have included both over the counter or prescription medications. Please review your pharmacy choice to make sure that you have choses a pharmacy that is open for you to pick up any needed prescription, Your safety is important to [place]. If you have drug allergies check your prescription carefully.   You can use MyChart to ask questions about today's visit, request a non-urgent call back, or ask for a work or school excuse for 24 hours related to this e-Visit. If it has been greater than 24 hours you will need to follow up with your provider, or enter a new e-Visit to address those concerns. You will get a MyChart message within the next two days asking about your experience. I hope that your e-visit has been valuable and will speed your recovery.  5-10 minutes spent reviewing and documenting in chart.

## 2019-07-08 NOTE — Progress Notes (Signed)
Based on what you shared with me it looks like you have somethiong going on with your skin ,that should be evaluated in a face to face office visit. I cannot make a definitive diagnosis to know what to give you. It is not yeast. youwill need to see your primary care provider for this.   NOTE: If you entered your credit card information for this eVisit, you will not be charged. You may see a "hold" on your card for the $35 but that hold will drop off and you will not have a charge processed.  If you are having a true medical emergency please call 911.     For an urgent face to face visit, [place] has four urgent care centers for your convenience:   . [facility]    [phone]                  Get Driving Directions  [address], [place] [address] . 10 am to 8 pm Monday-Friday . 12 pm to 8 pm Saturday-Sunday   . [facility] at [place]                  Get Driving Directions  [address], [place], [place] [address] . 8 am to 8 pm Monday-Friday . 9 am to 6 pm Saturday . 11 am to 6 pm Sunday   . [facility] at [place]                  Get Driving Directions   [address] [place], [place] [address] . 8 am to 8 pm Monday-Friday . 8 am to 4 pm Saturday-Sunday    . [facility] at [place]                    Get Driving Directions  [phone]  [address], [place],  [address]  . Monday-Friday, 12 PM to 6 PM    Your e-visit answers were reviewed by a board certified advanced clinical practitioner to complete your personal care plan.  Thank you for using e-Visits.

## 2019-07-16 ENCOUNTER — Telehealth: Payer: 59 | Admitting: Emergency Medicine

## 2019-07-16 DIAGNOSIS — J069 Acute upper respiratory infection, unspecified: Secondary | ICD-10-CM

## 2019-07-16 MED ORDER — METHYLPREDNISOLONE 4 MG PO TBPK: ORAL_TABLET | ORAL | 0 refills | Status: DC

## 2019-07-16 MED ORDER — BENZONATATE 100 MG PO CAPS: 100.0000 mg | ORAL_CAPSULE | Freq: Three times a day (TID) | ORAL | 0 refills | Status: DC | PRN

## 2019-07-16 NOTE — Progress Notes (Signed)
We are sorry you are not feeling well.  Here is how we plan to help!  Based on what you have shared with me, it looks like you may have a viral upper respiratory infection.  Upper respiratory infections are caused by a large number of viruses; however, rhinovirus is the most common cause.  Viral infections cannot be treated with an antibiotic. Zpacks have an antiinflammatory effect that makes coughs feel better therefore I will prescribe medications to calm your lung downs, but 3 days of cough does not warrant use of antibiotics.  Symptoms vary from person to person, with common symptoms including sore throat, cough, fatigue or lack of energy and feeling of general discomfort.  A low-grade fever of up to 100.4 may present, but is often uncommon.  Symptoms vary however, and are closely related to a person's age or underlying illnesses.  The most common symptoms associated with an upper respiratory infection are nasal discharge or congestion, cough, sneezing, headache and pressure in the ears and face.  These symptoms usually persist for about 3 to 10 days, but can last up to 2 weeks.  It is important to know that upper respiratory infections do not cause serious illness or complications in most cases.    Upper respiratory infections can be transmitted from person to person, with the most common method of transmission being a person's hands.  The virus is able to live on the skin and can infect other persons for up to 2 hours after direct contact.  Also, these can be transmitted when someone coughs or sneezes; thus, it is important to cover the mouth to reduce this risk.  To keep the spread of the illness at [place], good hand hygiene is very important.  This is an infection that is most likely caused by a virus. There are no specific treatments other than to help you with the symptoms until the infection runs its course.  We are sorry you are not feeling well.  Here is how we plan to help!   For nasal  congestion, you may use an oral decongestants such as Mucinex D or if you have glaucoma or high blood pressure use plain Mucinex.  Saline nasal spray or nasal drops can help and can safely be used as often as needed for congestion.   If you do not have a history of heart disease, hypertension, diabetes or thyroid disease, prostate/bladder issues or glaucoma, you may also use Sudafed to treat nasal congestion.  It is highly recommended that you consult with a pharmacist or your primary care physician to ensure this medication is safe for you to take.     If you have a cough, you may use cough suppressants such as Delsym and Robitussin.  If you have glaucoma or high blood pressure, you can also use Coricidin HBP.   For cough I have prescribed for you A prescription cough medication called Tessalon Perles 100 mg. You may take 1-2 capsules every 8 hours as needed for cough and Medrol Dosepack- use as directed for 5 days.  If you have a sore or scratchy throat, use a saltwater gargle-  to  teaspoon of salt dissolved in a 4-ounce to 8-ounce glass of warm water.  Gargle the solution for approximately 15-30 seconds and then spit.  It is important not to swallow the solution.  You can also use throat lozenges/cough drops and Chloraseptic spray to help with throat pain or discomfort.  Warm or cold liquids can also be helpful  in relieving throat pain.  For headache, pain or general discomfort, you can use Ibuprofen or Tylenol as directed.   Some authorities believe that zinc sprays or the use of Echinacea may shorten the course of your symptoms.   HOME CARE . Only take medications as instructed by your medical team. . Be sure to drink plenty of fluids. Water is fine as well as fruit juices, sodas and electrolyte beverages. You may want to stay away from caffeine or alcohol. If you are nauseated, try taking small sips of liquids. How do you know if you are getting enough fluid? Your urine should be a pale yellow  or almost colorless. . Get rest. . Taking a steamy shower or using a humidifier may help nasal congestion and ease sore throat pain. You can place a towel over your head and breathe in the steam from hot water coming from a faucet. . Using a saline nasal spray works much the same way. . Cough drops, hard candies and sore throat lozenges may ease your cough. . Avoid close contacts especially the very young and the elderly . Cover your mouth if you cough or sneeze . Always remember to wash your hands.   GET HELP RIGHT AWAY IF: . You develop worsening fever. . If your symptoms do not improve within 10 days . You develop yellow or green discharge from your nose over 3 days. . You have coughing fits . You develop a severe head ache or visual changes. . You develop shortness of breath, difficulty breathing or start having chest pain . Your symptoms persist after you have completed your treatment plan  MAKE SURE YOU   Understand these instructions.  Will watch your condition.  Will get help right away if you are not doing well or get worse.  Your e-visit answers were reviewed by a board certified advanced clinical practitioner to complete your personal care plan. Depending upon the condition, your plan could have included both over the counter or prescription medications. Please review your pharmacy choice. If there is a problem, you may call our nursing hot line at and have the prescription routed to another pharmacy. Your safety is important to [place]. If you have drug allergies check your prescription carefully.   You can use MyChart to ask questions about today's visit, request a non-urgent call back, or ask for a work or school excuse for 24 hours related to this e-Visit. If it has been greater than 24 hours you will need to follow up with your provider, or enter a new e-Visit to address those concerns. You will get an e-mail in the next two days asking about your experience.  I hope that  your e-visit has been valuable and will speed your recovery. Thank you for using e-visits.  Greater than 5 but less than 10 minutes spent researching, coordinating, and implementing care for this patient today

## 2019-07-22 ENCOUNTER — Telehealth: Payer: 59 | Admitting: Family

## 2019-07-22 DIAGNOSIS — R0602 Shortness of breath: Secondary | ICD-10-CM

## 2019-07-22 DIAGNOSIS — R059 Cough, unspecified: Secondary | ICD-10-CM

## 2019-07-22 DIAGNOSIS — R05 Cough: Secondary | ICD-10-CM

## 2019-07-22 NOTE — Progress Notes (Signed)
Based on what you shared with me, I feel your condition warrants further evaluation and I recommend that you be seen for a face to face office visit.see I   Given that you are having a call with shortness of breath you need to be seen face-to-face.   NOTE: If you entered your credit card information for this eVisit, you will not be charged. You may see a "hold" on your card for the $35 but that hold will drop off and you will not have a charge processed.   If you are having a true medical emergency please call 911.      For an urgent face to face visit, [place] has five urgent care centers for your convenience:      NEW:  [facility] at  Get Driving Directions [phone] [place], [place] [address] . 10 am - 6pm Monday - Friday    [facility]) Get Driving Directions [phone] [address] [place], [place] [address] . 10 am to 8 pm Monday-Friday . 12 pm to 8 pm [facility] at [facility] Get Driving Directions [phone] [address], [place], [place] [address] . 8 am to 8 pm Monday-Friday . 9 am to 6 pm Saturday . 11 am to 6 pm Sunday     [facility] at [facility] Get Driving Directions  [phone] [address] [place], [place] [address] . 8 am to 8 pm Monday-Friday . 8 am to 4 pm [facility] at [place] Get Driving Directions [phone] [address] [place], [place] [address] . 12 pm to 6 pm Monday-Friday      Your e-visit answers were reviewed by a board certified advanced clinical practitioner to complete your personal care plan.  Thank you for using e-Visits.

## 2019-08-15 ENCOUNTER — Other Ambulatory Visit: Payer: Self-pay | Admitting: Nurse Practitioner

## 2019-09-18 ENCOUNTER — Telehealth: Payer: 59 | Admitting: Physician Assistant

## 2019-09-18 ENCOUNTER — Other Ambulatory Visit (INDEPENDENT_AMBULATORY_CARE_PROVIDER_SITE_OTHER): Payer: Self-pay | Admitting: Nurse Practitioner

## 2019-09-18 DIAGNOSIS — L709 Acne, unspecified: Secondary | ICD-10-CM

## 2019-09-18 MED ORDER — DOXYCYCLINE HYCLATE 100 MG PO TABS: 100.0000 mg | ORAL_TABLET | Freq: Two times a day (BID) | ORAL | 0 refills | Status: DC

## 2019-09-18 NOTE — Progress Notes (Signed)
  We are sorry that you are experiencing this issue.  Here is how we plan to help!  Based on what you shared with me it looks like you have uncomplicated acne.  Acne is a disorder of the hair follicles and oil glands (sebaceous glands). The sebaceous glands secrete oils to keep the skin moist.  When the glands get clogged, it can lead to pimples or cysts.  These cysts may become infected and leave scars. Acne is very common and normally occurs at puberty.  Acne is also inherited.  Your personal care plan consists of the following recommendations:  I recommend that you use a daily cleanser  You might try an over the counter cleanser that has benzoyl peroxide.  I recommend that you start with a product that has 2.5% benzoyl peroxide.  Stronger concentrations have not been shown to be more effective.    I have also prescribed one of the following additional therapies:  Doxycycline an oral antibiotic 100 mg twice a day for 7 days.   If excessive dryness or peeling occurs, reduce dose frequency or concentration of the topical scrubs.  If excessive stinging or burning occurs, remove the topical gel with mild soap and water and resume at a lower dose the next day.  Remember oral antibiotics and topical acne treatments may increase your sensitivity to the sun!  HOME CARE:  Do not squeeze pimples because that can often lead to infections, worse acne, and scars.  Use a moisturizer that contains retinoid or fruit acids that may inhibit the development of new acne lesions.  Although there is not a clear link that foods can cause acne, doctors do believe that too many sweets predispose you to skin problems.  GET HELP RIGHT AWAY IF:  If your acne gets worse or is not better within 10 days.  If you become depressed.  If you become pregnant, discontinue medications and call your OB/GYN.  MAKE SURE YOU:  Understand these instructions.  Will watch your condition.  Will get help right away if  you are not doing well or get worse.   Your e-visit answers were reviewed by a board certified advanced clinical practitioner to complete your personal care plan.  Depending upon the condition, your plan could have included both over the counter or prescription medications.  Please review your pharmacy choice.  If there is a problem, you may contact your provider through [employer] and have the prescription routed to another pharmacy.  Your safety is important to [place].  If you have drug allergies check your prescription carefully.  For the next 24 hours you can use MyChart to ask questions about today's visit, request a non-urgent call back, or ask for a work or school excuse from your e-visit provider.  You will get an email in the next two days asking about your experience. I hope that your e-visit has been valuable and will speed your recovery.  I spent 5-10 minutes on review and completion of this note- [facility]

## 2019-09-23 ENCOUNTER — Telehealth: Payer: 59 | Admitting: Family

## 2019-09-23 DIAGNOSIS — H109 Unspecified conjunctivitis: Secondary | ICD-10-CM

## 2019-09-23 MED ORDER — POLYMYXIN B-TRIMETHOPRIM 10000-0.1 UNIT/ML-% OP SOLN: 1.0000 [drp] | Freq: Four times a day (QID) | OPHTHALMIC | 0 refills | Status: DC

## 2019-09-23 NOTE — Progress Notes (Signed)
E-Visit for Pink Eye   We are sorry that you are not feeling well.  Here is how we plan to help!  Based on what you have shared with me it looks like you have conjunctivitis.  Conjunctivitis is a common inflammatory or infectious condition of the eye that is often referred to as "pink eye".  In most cases it is contagious (viral or bacterial). However, not all conjunctivitis requires antibiotics (ex. Allergic).  We have made appropriate suggestions for you based upon your presentation.  I have prescribed Polytrim Ophthalmic drops 1-2 drops 4 times a day times 5 days  Pink eye can be highly contagious.  It is typically spread through direct contact with secretions, or contaminated objects or surfaces that one may have touched.  Strict handwashing is suggested with soap and water is urged.  If not available, use alcohol based had sanitizer.  Avoid unnecessary touching of the eye.  If you wear contact lenses, you will need to refrain from wearing them until you see no white discharge from the eye for at least 24 hours after being on medication.  You should see symptom improvement in 1-2 days after starting the medication regimen.  Call us if symptoms are not improved in 1-2 days.  Home Care:  Wash your hands often!  Do not wear your contacts until you complete your treatment plan.  Avoid sharing towels, bed linen, personal items with a person who has pink eye.  See attention for anyone in your home with similar symptoms.  Get Help Right Away If:  Your symptoms do not improve.  You develop blurred or loss of vision.  Your symptoms worsen (increased discharge, pain or redness)  Your e-visit answers were reviewed by a board certified advanced clinical practitioner to complete your personal care plan.  Depending on the condition, your plan could have included both over the counter or prescription medications.  If there is a problem please reply  once you have received a response from your  provider.  Your safety is important to us.  If you have drug allergies check your prescription carefully.    You can use MyChart to ask questions about today's visit, request a non-urgent call back, or ask for a work or school excuse for 24 hours related to this e-Visit. If it has been greater than 24 hours you will need to follow up with your provider, or enter a new e-Visit to address those concerns.   You will get an e-mail in the next two days asking about your experience.  I hope that your e-visit has been valuable and will speed your recovery. Thank you for using e-visits.  Approximately 5 minutes was spent documenting and reviewing patient's chart.      

## 2019-11-12 ENCOUNTER — Telehealth: Payer: 59 | Admitting: Nurse Practitioner

## 2019-11-12 DIAGNOSIS — N3000 Acute cystitis without hematuria: Secondary | ICD-10-CM

## 2019-11-12 MED ORDER — SULFAMETHOXAZOLE-TRIMETHOPRIM 800-160 MG PO TABS: 1.0000 | ORAL_TABLET | Freq: Two times a day (BID) | ORAL | 0 refills | Status: DC

## 2019-11-12 NOTE — Progress Notes (Signed)

## 2020-02-24 ENCOUNTER — Telehealth: Payer: 59 | Admitting: Nurse Practitioner

## 2020-02-24 DIAGNOSIS — L249 Irritant contact dermatitis, unspecified cause: Secondary | ICD-10-CM | POA: Diagnosis not present

## 2020-02-24 NOTE — Progress Notes (Signed)
E Visit for Rash  We are sorry that you are not feeling well. Here is how we plan to help!  Based on what you shared with me it looks like you have contact dermatitis.  Contact dermatitis is a skin rash caused by something that touches the skin and causes irritation or inflammation.  Your skin may be red, swollen, dry, cracked, and itch.  The rash should go away in a few days but can last a few weeks.  If you get a rash, it's important to figure out what caused it so the irritant can be avoided in the future. and I have prescribed Prednisone 20 mg 2 at sametime daily for 5 days.    HOME CARE:   Take cool showers and avoid direct sunlight.  Apply cool compress or wet dressings.  Take a bath in an oatmeal bath.  Sprinkle content of one Aveeno packet under running faucet with comfortably warm water.  Bathe for 15-20 minutes, 1-2 times daily.  Pat dry with a towel. Do not rub the rash.  Use hydrocortisone cream.  Take an antihistamine like Benadryl for widespread rashes that itch.  The adult dose of Benadryl is 25-50 mg by mouth 4 times daily.  Caution:  This type of medication may cause sleepiness.  Do not drink alcohol, drive, or operate dangerous machinery while taking antihistamines.  Do not take these medications if you have prostate enlargement.  Read package instructions thoroughly on all medications that you take.  GET HELP RIGHT AWAY IF:   Symptoms don't go away after treatment.  Severe itching that persists.  If you rash spreads or swells.  If you rash begins to smell.  If it blisters and opens or develops a yellow-brown crust.  You develop a fever.  You have a sore throat.  You become short of breath.  MAKE SURE YOU:  Understand these instructions. Will watch your condition. Will get help right away if you are not doing well or get worse.  Thank you for choosing an e-visit. Your e-visit answers were reviewed by a board certified advanced clinical practitioner  to complete your personal care plan. Depending upon the condition, your plan could have included both over the counter or prescription medications. Please review your pharmacy choice. Be sure that the pharmacy you have chosen is open so that you can pick up your prescription now.  If there is a problem you may message your provider in MyChart to have the prescription routed to another pharmacy. Your safety is important to [place]. If you have drug allergies check your prescription carefully.  For the next 24 hours, you can use MyChart to ask questions about today's visit, request a non-urgent call back, or ask for a work or school excuse from your e-visit provider. You will get an email in the next two days asking about your experience. I hope that your e-visit has been valuable and will speed your recovery.  5-10 minutes spent reviewing and documenting in chart.

## 2020-04-01 ENCOUNTER — Other Ambulatory Visit: Payer: 59

## 2020-04-01 ENCOUNTER — Other Ambulatory Visit: Payer: Self-pay | Admitting: Obstetrics & Gynecology

## 2020-04-01 DIAGNOSIS — N926 Irregular menstruation, unspecified: Secondary | ICD-10-CM

## 2020-04-27 ENCOUNTER — Other Ambulatory Visit: Payer: Self-pay | Admitting: Obstetrics & Gynecology

## 2020-04-27 DIAGNOSIS — N926 Irregular menstruation, unspecified: Secondary | ICD-10-CM

## 2020-05-25 ENCOUNTER — Telehealth: Payer: Medicaid Other | Admitting: Family

## 2020-05-25 DIAGNOSIS — R110 Nausea: Secondary | ICD-10-CM

## 2020-05-25 MED ORDER — ONDANSETRON HCL 4 MG PO TABS: 4.0000 mg | ORAL_TABLET | Freq: Three times a day (TID) | ORAL | 0 refills | Status: DC | PRN

## 2020-05-25 NOTE — Progress Notes (Signed)
We are sorry that you are not feeling well. Here is how we plan to help!  Based on what you have shared with me it looks like you have nausea.  When we treat short term symptoms, we always caution that any symptoms that persist should be fully evaluated in a medical office.  I have prescribed a medication that will help alleviate your symptoms and allow you to stay hydrated:  Zofran 4 mg 1 tablet every 8 hours as needed for nausea and vomiting  HOME CARE:  Drink clear liquids.  This is very important! Dehydration (the lack of fluid) can lead to a serious complication.  Start off with 1 tablespoon every 5 minutes for 8 hours.  You may begin eating bland foods after 8 hours without vomiting.  Start with saltine crackers, white bread, rice, mashed potatoes, applesauce.  After 48 hours on a bland diet, you may resume a normal diet.  Try to go to sleep.  Sleep often empties the stomach and relieves the need to vomit.  GET HELP RIGHT AWAY IF:   Your symptoms do not improve or worsen within 2 days after treatment.  You have a fever for over 3 days.  You cannot keep down fluids after trying the medication.  MAKE SURE YOU:   Understand these instructions.  Will watch your condition.  Will get help right away if you are not doing well or get worse.   Thank you for choosing an e-visit. Your e-visit answers were reviewed by a board certified advanced clinical practitioner to complete your personal care plan. Depending upon the condition, your plan could have included both over the counter or prescription medications. Please review your pharmacy choice. Be sure that the pharmacy you have chosen is open so that you can pick up your prescription now.  If there is a problem you may message your provider in MyChart to have the prescription routed to another pharmacy. Your safety is important to [place]. If you have drug allergies check your prescription carefully.  For the next 24 hours, you can  use MyChart to ask questions about today's visit, request a non-urgent call back, or ask for a work or school excuse from your e-visit provider. You will get an e-mail in the next two days asking about your experience. I hope that your e-visit has been valuable and will speed your recovery.  Approximately 5 minutes was spent documenting and reviewing patient's chart.

## 2020-06-07 ENCOUNTER — Ambulatory Visit: Payer: Medicaid Other | Admitting: Obstetrics and Gynecology

## 2020-06-07 NOTE — Progress Notes (Deleted)
PCP:  Evelene Croon, MD   No chief complaint on file.    HPI:      Sara [name] is a 34 y.o. [phone] whose LMP was No LMP recorded., presents today for her annual examination.  Her menses are {norm/abn:715}, lasting {number:22536} days.  Dysmenorrhea {dysmen:716}. She {does:18564} have intermenstrual bleeding.  Sex activity: {sex active:315163}.  Last Pap: 03/15/20  Results were: ASCUS with NEGATIVE high risk HPV ; repeat due after 3 yrs but will do today Hx of STDs: {STD hx:14358}  There is no FH of breast cancer. There is no FH of ovarian cancer. The patient {does:18564} do self-breast exams.  Tobacco use: {tob:20664} Alcohol use: {Alcohol:11675} No drug use.  Exercise: {exercise:31265}  She {does:18564} get adequate calcium and Vitamin D in her diet.   The pregnancy intention screening data noted above was reviewed. Potential methods of contraception were discussed. The patient elected to proceed with {Upstream End Methods:24109}.     Past Medical History:  Diagnosis Date  . Abnormal Pap smear of cervix   . GERD (gastroesophageal reflux disease)   . Kidney stone   . Seizures (HCC)     Past Surgical History:  Procedure Laterality Date  . CHOLECYSTECTOMY    . lipoma removal  2013  . TONSILLECTOMY  2009  . TUBAL LIGATION  2014    No family history on file.  Social History   Socioeconomic History  . Marital status: Single    Spouse name: Not on file  . Number of children: Not on file  . Years of education: Not on file  . Highest education level: Not on file  Occupational History  . Not on file  Tobacco Use  . Smoking status: Current Some Day Smoker    Types: Cigarettes  . Smokeless tobacco: Never Used  Vaping Use  . Vaping Use: Never used  Substance and Sexual Activity  . Alcohol use: Not Currently  . Drug use: No  . Sexual activity: Yes    Birth control/protection: Surgical    Comment: Tubal Ligation  Other Topics Concern  . Not on  file  Social History Narrative  . Not on file   Social Determinants of Health   Financial Resource Strain:   . Difficulty of Paying Living Expenses: Not on file  Food Insecurity:   . Worried About Programme researcher, broadcasting/film/video in the Last Year: Not on file  . Ran Out of Food in the Last Year: Not on file  Transportation Needs:   . Lack of Transportation (Medical): Not on file  . Lack of Transportation (Non-Medical): Not on file  Physical Activity:   . Days of Exercise per Week: Not on file  . Minutes of Exercise per Session: Not on file  Stress:   . Feeling of Stress : Not on file  Social Connections:   . Frequency of Communication with Friends and Family: Not on file  . Frequency of Social Gatherings with Friends and Family: Not on file  . Attends Religious Services: Not on file  . Active Member of Clubs or Organizations: Not on file  . Attends Banker Meetings: Not on file  . Marital Status: Not on file  Intimate Partner Violence:   . Fear of Current or Ex-Partner: Not on file  . Emotionally Abused: Not on file  . Physically Abused: Not on file  . Sexually Abused: Not on file     Current Outpatient Medications:  .  benzonatate (TESSALON PERLES)  100 MG capsule, Take 1 capsule (100 mg total) by mouth 3 (three) times daily as needed for cough (cough)., Disp: 20 capsule, Rfl: 0 .  clindamycin (CLINDAGEL) 1 % gel, Apply topically 2 (two) times daily., Disp: 30 g, Rfl: 0 .  doxycycline (VIBRA-TABS) 100 MG tablet, Take 1 tablet (100 mg total) by mouth 2 (two) times daily., Disp: 14 tablet, Rfl: 0 .  doxycycline (VIBRA-TABS) 100 MG tablet, Take 1 tablet (100 mg total) by mouth 2 (two) times daily., Disp: 14 tablet, Rfl: 0 .  methylPREDNISolone (MEDROL DOSEPAK) 4 MG TBPK tablet, Use as directed, Disp: 21 tablet, Rfl: 0 .  ondansetron (ZOFRAN) 4 MG tablet, Take 1 tablet (4 mg total) by mouth every 8 (eight) hours as needed for nausea or vomiting., Disp: 20 tablet, Rfl: 0 .   sulfamethoxazole-trimethoprim (BACTRIM DS) 800-160 MG tablet, Take 1 tablet by mouth 2 (two) times daily., Disp: 10 tablet, Rfl: 0 .  trimethoprim-polymyxin b (POLYTRIM) ophthalmic solution, Place 1 drop into the left eye every 6 (six) hours., Disp: 10 mL, Rfl: 0     ROS:  Review of Systems BREAST: No symptoms   Objective: There were no vitals taken for this visit.   OBGyn Exam  Results: No results found for this or any previous visit (from the past 24 hour(s)).  Assessment/Plan: No diagnosis found.  No orders of the defined types were placed in this encounter.            GYN counsel {counseling:16159}     F/U  No follow-ups on file.  Ashton Sabine B. Saranda Legrande, PA-C 06/07/2020 8:18 AM

## 2020-06-22 ENCOUNTER — Other Ambulatory Visit: Payer: Self-pay | Admitting: Obstetrics and Gynecology

## 2020-06-22 ENCOUNTER — Telehealth: Payer: Self-pay | Admitting: Obstetrics and Gynecology

## 2020-06-22 DIAGNOSIS — Z3201 Encounter for pregnancy test, result positive: Secondary | ICD-10-CM

## 2020-06-22 NOTE — Progress Notes (Signed)
Pt with pos UPT. Hx of tubal reversal. First available NOB appt in 4 wks. Check serum betas for now.

## 2020-06-22 NOTE — Telephone Encounter (Signed)
Per ABC to scheduled appointment for Beta labs for Thursday, 06/23/20 and repeat Monday, 06/27/20. ABC will call with results. Per AMS (verbal)patient needs beta trends since patient is scheduled out to 07/13/20 for NOB. And no opening in our schedule.

## 2020-06-22 NOTE — Telephone Encounter (Signed)
Patient is scheduled for her lab draws on Thursday and Monday.

## 2020-06-23 ENCOUNTER — Other Ambulatory Visit: Payer: Self-pay

## 2020-06-23 ENCOUNTER — Other Ambulatory Visit: Payer: Medicaid Other

## 2020-06-23 DIAGNOSIS — Z3201 Encounter for pregnancy test, result positive: Secondary | ICD-10-CM

## 2020-06-24 LAB — BETA HCG QUANT (REF LAB): hCG Quant: <1 m[IU]/mL

## 2020-06-27 ENCOUNTER — Other Ambulatory Visit: Payer: Medicaid Other

## 2020-06-27 NOTE — Telephone Encounter (Signed)
Cancel NOB appt. I'll let her deal with [facility]

## 2020-06-27 NOTE — Telephone Encounter (Signed)
Pls cancel appts. Thx.

## 2020-06-30 ENCOUNTER — Encounter: Payer: Self-pay | Admitting: Obstetrics and Gynecology

## 2020-07-04 ENCOUNTER — Encounter: Payer: Self-pay | Admitting: Obstetrics and Gynecology

## 2020-07-13 ENCOUNTER — Encounter: Payer: Medicaid Other | Admitting: Advanced Practice Midwife

## 2020-07-14 ENCOUNTER — Telehealth: Payer: Medicaid Other | Admitting: Family

## 2020-07-14 DIAGNOSIS — R112 Nausea with vomiting, unspecified: Secondary | ICD-10-CM

## 2020-07-14 MED ORDER — ONDANSETRON HCL 4 MG PO TABS: 4.0000 mg | ORAL_TABLET | Freq: Three times a day (TID) | ORAL | 0 refills | Status: DC | PRN

## 2020-07-14 NOTE — Progress Notes (Signed)
We are sorry that you are not feeling well. Here is how we plan to help!    Vomiting is the forceful emptying of a portion of the stomach's content through the mouth.  Although nausea and vomiting can make you feel miserable, it's important to remember that these are not diseases, but rather symptoms of an underlying illness.  When we treat short term symptoms, we always caution that any symptoms that persist should be fully evaluated in a medical office.  I have prescribed a medication that will help alleviate your symptoms and allow you to stay hydrated:  Zofran 4 mg 1 tablet every 8 hours as needed for nausea and vomiting  HOME CARE:  Drink clear liquids.  This is very important! Dehydration (the lack of fluid) can lead to a serious complication.  Start off with 1 tablespoon every 5 minutes for 8 hours.  You may begin eating bland foods after 8 hours without vomiting.  Start with saltine crackers, white bread, rice, mashed potatoes, applesauce.  After 48 hours on a bland diet, you may resume a normal diet.  Try to go to sleep.  Sleep often empties the stomach and relieves the need to vomit.  GET HELP RIGHT AWAY IF:   Your symptoms do not improve or worsen within 2 days after treatment.  You have a fever for over 3 days.  You cannot keep down fluids after trying the medication.  MAKE SURE YOU:   Understand these instructions.  Will watch your condition.  Will get help right away if you are not doing well or get worse.   Thank you for choosing an e-visit. Your e-visit answers were reviewed by a board certified advanced clinical practitioner to complete your personal care plan. Depending upon the condition, your plan could have included both over the counter or prescription medications. Please review your pharmacy choice. Be sure that the pharmacy you have chosen is open so that you can pick up your prescription now.  If there is a problem you may message your provider in  MyChart to have the prescription routed to another pharmacy. Your safety is important to [place]. If you have drug allergies check your prescription carefully.  For the next 24 hours, you can use MyChart to ask questions about today's visit, request a non-urgent call back, or ask for a work or school excuse from your e-visit provider. You will get an e-mail in the next two days asking about your experience. I hope that your e-visit has been valuable and will speed your recovery.  Greater than 5 minutes, yet less than 10 minutes of time have been spent researching, coordinating, and implementing care for this patient.

## 2020-08-06 ENCOUNTER — Other Ambulatory Visit: Payer: Self-pay | Admitting: Family

## 2020-08-06 ENCOUNTER — Telehealth: Payer: Medicaid Other | Admitting: Physician Assistant

## 2020-08-06 DIAGNOSIS — R112 Nausea with vomiting, unspecified: Secondary | ICD-10-CM

## 2020-08-06 NOTE — Progress Notes (Signed)
For the safety of you and your child, I recommend a face to face office visit with a health care provider.  Many mothers need to take medicines during their pregnancy and while nursing.  Almost all medicines pass into the breast milk in small quantities.  Most are generally considered safe for a mother to take but some medicines must be avoided.  After reviewing your E-Visit request, I recommend that you consult your OB/GYN or pediatrician for medical advice in relation to your condition and prescription medications while pregnant or breastfeeding.  NOTE: If you entered your credit card information for this eVisit, you will not be charged. You may see a "hold" on your card for the $35 but that hold will drop off and you will not have a charge processed.  If you are having a true medical emergency please call 911.     For an urgent face to face visit, [place] has four urgent care centers for your convenience:    NEW:  [facility] [place] [phone] [address] [place], [place] [address]   Monday - Friday 10 am - 6 pm      [facility]    [phone]                  Get Driving Directions  [address] [place], [place] [address]  10 am to 8 pm Monday-Friday  12 pm to 8 pm [facility] at [facility]  [phone]                  Get Driving Directions  [address] [place], [place] [address]  8 am to 8 pm Monday-Friday  9 am to 6 pm Saturday  11 am to 6 pm Sunday    [facility] at [facility]  [phone]                  Get Driving Directions   [address] [place], [place] [address]  8 am to 8 pm Monday-Friday  8 am to 4 pm [facility] at [facility] Directions  [phone]  [address] [place], [place] [address]   Monday-Friday, 12 PM  to 6 PM    Your e-visit answers were reviewed by a board certified advanced clinical practitioner to complete your personal care plan.  Thank you for using e-Visits.  Greater than 5 minutes, yet less than 10 minutes of time have been spent researching, coordinating, and implementing care for this patient today.   Jarold Motto PA-C

## 2020-08-15 ENCOUNTER — Other Ambulatory Visit: Payer: Self-pay

## 2020-08-15 ENCOUNTER — Other Ambulatory Visit (HOSPITAL_COMMUNITY)
Admission: RE | Admit: 2020-08-15 | Discharge: 2020-08-15 | Disposition: A | Discharge status: Home / Self Care | Payer: Medicaid Other | Source: Ambulatory Visit | Attending: Obstetrics | Admitting: Obstetrics

## 2020-08-15 ENCOUNTER — Ambulatory Visit (INDEPENDENT_AMBULATORY_CARE_PROVIDER_SITE_OTHER): Payer: Medicaid Other | Admitting: Obstetrics

## 2020-08-15 ENCOUNTER — Encounter: Payer: Self-pay | Admitting: Obstetrics

## 2020-08-15 VITALS — BP 130/66 | Wt 140.0 lb

## 2020-08-15 DIAGNOSIS — N926 Irregular menstruation, unspecified: Secondary | ICD-10-CM

## 2020-08-15 DIAGNOSIS — Z3480 Encounter for supervision of other normal pregnancy, unspecified trimester: Secondary | ICD-10-CM | POA: Insufficient documentation

## 2020-08-15 DIAGNOSIS — Z124 Encounter for screening for malignant neoplasm of cervix: Secondary | ICD-10-CM | POA: Insufficient documentation

## 2020-08-15 DIAGNOSIS — Z3A09 9 weeks gestation of pregnancy: Secondary | ICD-10-CM

## 2020-08-15 LAB — POCT URINALYSIS DIPSTICK OB
Glucose, UA: NEGATIVE
POC,PROTEIN,UA: NEGATIVE

## 2020-08-15 NOTE — Addendum Note (Signed)
Addended by: Loran Senters D on: 08/15/2020 03:49 PM   Modules accepted: Orders

## 2020-08-15 NOTE — Progress Notes (Signed)
New Obstetric Patient H&P    Chief Complaint: "Desires prenatal care"   History of Present Illness: Patient is a 34 y.o. [phone] Not Hispanic or Latino female, LMP 06/27/2020 presents with amenorrhea and positive home pregnancy test. Based on her  LMP, her EDD is Estimated Date of Delivery: 04/03/21 and her EGA is [redacted]w[redacted]d. Cycles are 5. days, regular, and occur approximately every : 28 days. Her last pap smear was about 1 years ago and was no abnormalities.    She had a urine pregnancy test which was positive about 2 month(s)  ago. Her last menstrual period was normal and lasted for  about 5 day(s). Since her LMP she claims she has experienced nausea and breast tenderness. She denies vaginal bleeding. Her past medical history is noncontributory. Her prior pregnancies are notable for none she is pregnanct due to a BTL reversal ahd had performed 6 months ago.  Since her LMP, she admits to the use of tobacco products  no She claims she has gained   no pounds since the start of her pregnancy.  There are cats in the home in the home  no I She admits close contact with children on a regular basis  yes  She has had chicken pox in the past yes She has had Tuberculosis exposures, symptoms, or previously tested positive for TB   no Current or past history of domestic violence. no  Genetic Screening/Teratology Counseling: (Includes patient, baby's father, or anyone in either family with:)   1. Patient's age >/= 28 at [facility]  no 2. Thalassemia ([place], [place], Mediterranean, or Asian background): MCV<80  no 3. Neural tube defect (meningomyelocele, spina bifida, anencephaly)  no 4. Congenital heart defect  no  5. Down syndrome  no 6. Tay-Sachs (Jewish, [place]  no 7. Canavan's Disease  no 8. Sickle cell disease or trait (African)  no  9. Hemophilia or other blood disorders  no  10. Muscular dystrophy  no  11. Cystic fibrosis  no  12. Huntington's Chorea  no  13. Mental  retardation/autism  no 14. Other inherited genetic or chromosomal disorder  not applicable 15. Maternal metabolic disorder (DM, PKU, etc)  no 16. Patient or FOB with a child with a birth defect not listed above no  16a. Patient or FOB with a birth defect themselves no 17. Recurrent pregnancy loss, or stillbirth  no  18. Any medications since LMP other than prenatal vitamins (include vitamins, supplements, OTC meds, drugs, alcohol)  no 19. Any other genetic/environmental exposure to discuss  no  Infection History:   1. Lives with someone with TB or TB exposed  no  2. Patient or partner has history of genital herpes  no 3. Rash or viral illness since LMP  no 4. History of STI (GC, CT, HPV, syphilis, HIV)  Yes chlamydia in 2018- treated 5. History of recent travel :  no  Other pertinent information:  no     Review of Systems:10 point review of systems negative unless otherwise noted in HPI  Past Medical History:  Past Medical History:  Diagnosis Date  . Abnormal Pap smear of cervix   . GERD (gastroesophageal reflux disease)   . Kidney stone   . Seizures (HCC)     Past Surgical History:  Past Surgical History:  Procedure Laterality Date  . CHOLECYSTECTOMY    . lipoma removal  2013  . TONSILLECTOMY  2009  . TUBAL LIGATION  2014  . TUBAL REVERSAL  01/2020    [facility]    Gynecologic History: Patient's last menstrual period was 06/27/2020.  Obstetric History: [id number]  Family History:  Family History  Problem Relation Age of Onset  . Diabetes Maternal Grandmother   . Stroke Maternal Great-grandmother   . Thyroid disease Maternal Aunt     Social History:  Social History   Socioeconomic History  . Marital status: Single    Spouse name: Not on file  . Number of children: Not on file  . Years of education: Not on file  . Highest education level: Not on file  Occupational History  . Not on file  Tobacco Use  . Smoking status: Former Smoker    Types: Cigarettes     Quit date: 07/25/2020    Years since quitting: 0.0  . Smokeless tobacco: Never Used  Vaping Use  . Vaping Use: Never used  Substance and Sexual Activity  . Alcohol use: Not Currently  . Drug use: No  . Sexual activity: Yes    Birth control/protection: Surgical    Comment: Tubal Ligation/Reversal  Other Topics Concern  . Not on file  Social History Narrative  . Not on file   Social Determinants of Health   Financial Resource Strain: Not on file  Food Insecurity: Not on file  Transportation Needs: Not on file  Physical Activity: Not on file  Stress: Not on file  Social Connections: Not on file  Intimate Partner Violence: Not on file    Allergies:  Allergies  Allergen Reactions  . Phenergan [Promethazine Hcl] Nausea And Vomiting    Medications: Prior to Admission medications   Medication Sig Start Date End Date Taking? Authorizing Provider  Prenatal Vit-Fe Fumarate-FA (MULTIVITAMIN-PRENATAL) 27-0.8 MG TABS tablet Take 1 tablet by mouth daily at 12 noon.   Yes [provider]    Physical Exam Vitals: Blood pressure 130/66, weight 140 lb (63.5 kg), last menstrual period 06/27/2020.  General: NAD HEENT: normocephalic, anicteric Thyroid: no enlargement, no palpable nodules Pulmonary: No increased work of breathing, CTAB Cardiovascular: RRR, distal pulses 2+ Abdomen: NABS, soft, non-tender, non-distended.  Umbilicus without lesions.  No hepatomegaly, splenomegaly or masses palpable. No evidence of hernia  Genitourinary:  External: Normal external female genitalia.  Normal urethral meatus, normal  Bartholin's and Skene's glands.    Vagina: Normal vaginal mucosa, no evidence of prolapse.    Cervix: Grossly normal in appearance, no bleeding  Uterus: anteverted, sl enlarged, mobile, normal contour.  No CMT  Adnexa: ovaries non-enlarged, no adnexal masses  Rectal: deferred Extremities: no edema, erythema, or tenderness Neurologic: Grossly intact Psychiatric: mood  appropriate, affect full   Assessment: 34 y.o. [id number] at [redacted]w[redacted]d presenting to initiate prenatal care  Plan: 1) Avoid alcoholic beverages. 2) Patient encouraged not to smoke.  3) Discontinue the use of all non-medicinal drugs and chemicals.  4) Take prenatal vitamins daily.  5) Nutrition, food safety (fish, cheese advisories, and high nitrite foods) and exercise discussed. 6) Hospital and practice style discussed with cross coverage system.  7) Genetic Screening, such as with 1st Trimester Screening, cell free fetal DNA, AFP testing, and Ultrasound, as well as with amniocentesis and CVS as appropriate, is discussed with patient. At the conclusion of today's visit patient undecided genetic testing 8) Patient is asked about travel to areas at risk for the [place] virus, and counseled to avoid travel and exposure to mosquitoes or sexual partners who may have themselves been exposed to the virus. Testing is discussed, and will be ordered as  appropriate.   Sara [name] will set up her next appt for 2 wks out, with dating scan and blood work (already ordered)

## 2020-08-15 NOTE — Progress Notes (Signed)
C/O nervous about tubal preg d/t reversal. rj

## 2020-08-17 LAB — URINE CULTURE: Organism ID, Bacteria: NO GROWTH

## 2020-08-21 LAB — CYTOLOGY - PAP
Chlamydia: NEGATIVE
Comment: NEGATIVE
Comment: NEGATIVE
Comment: NEGATIVE
Comment: NORMAL
Diagnosis: NEGATIVE
Diagnosis: REACTIVE
High risk HPV: POSITIVE — AB
Neisseria Gonorrhea: NEGATIVE
Trichomonas: NEGATIVE

## 2020-08-23 ENCOUNTER — Encounter: Payer: Self-pay | Admitting: Obstetrics

## 2020-08-27 NOTE — L&D Delivery Note (Signed)
Date of delivery: 03/14/2021 Estimated Date of Delivery: 04/03/21 Patient's last menstrual period was 06/27/2020 (exact date). EGA: [redacted]w[redacted]d  Delivery Note At 7:05 AM a viable female was delivered via Vaginal, Spontaneous Presentation:  Occiput Anterior, LOA,   APGAR: 8, 9; weight: 5#10oz, 2540 g Placenta status: Spontaneous, Intact.   Cord: 3 vessels with the following complications: None.  Cord pH: NA  Called to see patient.  Mom pushed to deliver a viable female infant.  The head followed by shoulders, which delivered without difficulty, and the rest of the body.  No nuchal cord noted.  Baby to mom's chest.  Cord clamped and cut after 3 min delay.  Cord blood obtained.  Placenta delivered spontaneously, intact, with a 3-vessel cord.  All counts correct.  Hemostasis obtained with IV pitocin and fundal massage.   Anesthesia: None Episiotomy: None Lacerations: None Suture Repair:  NA Est. Blood Loss (mL):  200  Mom to postpartum.  Baby to Couplet care / Skin to Skin.  Tresea Mall, CNM 03/14/2021, 7:47 AM

## 2020-08-30 ENCOUNTER — Other Ambulatory Visit: Payer: Self-pay | Admitting: Advanced Practice Midwife

## 2020-08-30 DIAGNOSIS — O219 Vomiting of pregnancy, unspecified: Secondary | ICD-10-CM

## 2020-08-30 MED ORDER — ONDANSETRON 4 MG PO TBDP: 4.0000 mg | ORAL_TABLET | Freq: Four times a day (QID) | ORAL | 1 refills | Status: DC | PRN

## 2020-08-30 NOTE — Progress Notes (Signed)
Zofran Rx sent for N/V per patient request.

## 2020-09-02 ENCOUNTER — Ambulatory Visit (INDEPENDENT_AMBULATORY_CARE_PROVIDER_SITE_OTHER): Payer: Medicaid Other

## 2020-09-02 ENCOUNTER — Encounter: Payer: Self-pay | Admitting: Advanced Practice Midwife

## 2020-09-02 ENCOUNTER — Ambulatory Visit (INDEPENDENT_AMBULATORY_CARE_PROVIDER_SITE_OTHER): Payer: Medicaid Other | Admitting: Advanced Practice Midwife

## 2020-09-02 ENCOUNTER — Other Ambulatory Visit: Payer: Self-pay

## 2020-09-02 VITALS — BP 128/68 | Wt 140.0 lb

## 2020-09-02 DIAGNOSIS — Z3480 Encounter for supervision of other normal pregnancy, unspecified trimester: Secondary | ICD-10-CM

## 2020-09-02 DIAGNOSIS — Z3A09 9 weeks gestation of pregnancy: Secondary | ICD-10-CM

## 2020-09-02 DIAGNOSIS — Z3481 Encounter for supervision of other normal pregnancy, first trimester: Secondary | ICD-10-CM

## 2020-09-02 DIAGNOSIS — Z1379 Encounter for other screening for genetic and chromosomal anomalies: Secondary | ICD-10-CM

## 2020-09-02 NOTE — Progress Notes (Signed)
Routine Prenatal Care Visit  Subjective  Sara [name] is a 35 y.o. [phone] at [redacted]w[redacted]d being seen today for ongoing prenatal care.  She is currently monitored for the following issues for this low-risk pregnancy and has Supervision of other normal pregnancy, antepartum on their problem list.  ----------------------------------------------------------------------------------- Patient reports continued nausea and vomiting despite taking zofran. She is allergic to phenergan and will try unisom/B6 combination.    . Vag. Bleeding: None.   . Leaking Fluid denies.  ----------------------------------------------------------------------------------- The following portions of the patient's history were reviewed and updated as appropriate: allergies, current medications, past family history, past medical history, past social history, past surgical history and problem list. Problem list updated.  Objective  Blood pressure 128/68, weight 140 lb (63.5 kg), last menstrual period 06/27/2020. Pregravid weight 132 lb (59.9 kg) Total Weight Gain 8 lb (3.629 kg) Urinalysis: Urine Protein    Urine Glucose    Fetal Status: Fetal Heart Rate (bpm): 167          Dating: 9 weeks 0 days, no adjustment of EDD for 4 day difference  General:  Alert, oriented and cooperative. Patient is in no acute distress.  Skin: Skin is warm and dry. No rash noted.   Cardiovascular: Normal heart rate noted  Respiratory: Normal respiratory effort, no problems with respiration noted  Abdomen: Soft, gravid, appropriate for gestational age.       Pelvic:  Cervical exam deferred        Extremities: Normal range of motion.     Mental Status: Normal mood and affect. Normal behavior. Normal judgment and thought content.   Assessment   35 y.o. [id number] at [redacted]w[redacted]d by  04/03/2021, by Last Menstrual Period presenting for routine prenatal visit  Plan   FOURTH Problems (from 08/15/20 to present)    Problem Noted Resolved   Supervision of other  normal pregnancy, antepartum 08/15/2020 by Mirna Mires, CNM No   Overview Addendum 08/23/2020  9:37 AM by Mirna Mires, CNM     Clinic  Prenatal Labs  Dating By LMP c/w 9w u/s Blood type:     Genetic Screen 1 Screen:    AFP:     Quad:     NIPS: Antibody:   Anatomic [place]  Rubella:    GTT Early:               Third trimester:  RPR:     Flu vaccine  HBsAg:     TDaP vaccine                                               Rhogam: HIV:     Baby Food          Breast                                   GBS: (For PCN allergy, check sensitivities)  Contraception  Pap: NILM with high risk HPV  Circumcision    Pediatrician    Support Person              Previous Version       Preterm labor symptoms and general obstetric precautions including but not limited to vaginal bleeding, contractions, leaking of fluid and fetal movement were reviewed in detail with the patient.  N/V:  stay hydrated with small sips throughout the day, sea bands, ginger or sour lemon, small frequent amounts of food   Return in about 4 weeks (around 09/30/2020) for rob.  Tresea Mall, CNM 09/02/2020 2:28 PM

## 2020-09-03 LAB — RPR+RH+ABO+RUB AB+AB SCR+CB...
Antibody Screen: NEGATIVE
HIV Screen 4th Generation wRfx: NONREACTIVE
Hematocrit: 41 % (ref 34.0–46.6)
Hemoglobin: 14.3 g/dL (ref 11.1–15.9)
Hepatitis B Surface Ag: NEGATIVE
MCH: 32.7 pg (ref 26.6–33.0)
MCHC: 34.9 g/dL (ref 31.5–35.7)
MCV: 94 fL (ref 79–97)
Platelets: 256 10*3/uL (ref 150–450)
RBC: 4.37 x10E6/uL (ref 3.77–5.28)
RDW: 12 % (ref 11.7–15.4)
RPR Ser Ql: NONREACTIVE
Rh Factor: POSITIVE
Rubella Antibodies, IGG: 1.81 index (ref >0.99)
Varicella zoster IgG: 709 index (ref >165)
WBC: 9.8 10*3/uL (ref 3.4–10.8)

## 2020-09-08 LAB — MATERNIT21 PLUS CORE+SCA
Fetal Fraction: 7
Monosomy X (Turner Syndrome): NOT DETECTED
Result (T21): NEGATIVE
Trisomy 13 (Patau syndrome): NEGATIVE
Trisomy 18 (Edwards syndrome): NEGATIVE
Trisomy 21 (Down syndrome): NEGATIVE
XXX (Triple X Syndrome): NOT DETECTED
XXY (Klinefelter Syndrome): NOT DETECTED
XYY (Jacobs Syndrome): NOT DETECTED

## 2020-09-14 ENCOUNTER — Other Ambulatory Visit: Payer: Self-pay | Admitting: Obstetrics and Gynecology

## 2020-09-14 DIAGNOSIS — O219 Vomiting of pregnancy, unspecified: Secondary | ICD-10-CM

## 2020-09-14 MED ORDER — ONDANSETRON 4 MG PO TBDP: 4.0000 mg | ORAL_TABLET | Freq: Four times a day (QID) | ORAL | 1 refills | Status: DC | PRN

## 2020-09-30 ENCOUNTER — Ambulatory Visit (INDEPENDENT_AMBULATORY_CARE_PROVIDER_SITE_OTHER): Payer: Medicaid Other | Admitting: Obstetrics and Gynecology

## 2020-09-30 ENCOUNTER — Encounter: Payer: Self-pay | Admitting: Obstetrics and Gynecology

## 2020-09-30 ENCOUNTER — Other Ambulatory Visit: Payer: Self-pay

## 2020-09-30 VITALS — BP 100/60 | Wt 145.0 lb

## 2020-09-30 DIAGNOSIS — Z3480 Encounter for supervision of other normal pregnancy, unspecified trimester: Secondary | ICD-10-CM

## 2020-09-30 DIAGNOSIS — Z3689 Encounter for other specified antenatal screening: Secondary | ICD-10-CM

## 2020-09-30 DIAGNOSIS — Z3A13 13 weeks gestation of pregnancy: Secondary | ICD-10-CM

## 2020-09-30 DIAGNOSIS — Z3481 Encounter for supervision of other normal pregnancy, first trimester: Secondary | ICD-10-CM

## 2020-09-30 DIAGNOSIS — O219 Vomiting of pregnancy, unspecified: Secondary | ICD-10-CM

## 2020-09-30 LAB — POCT URINALYSIS DIPSTICK OB
Glucose, UA: NEGATIVE
POC,PROTEIN,UA: NEGATIVE

## 2020-09-30 MED ORDER — DOXYLAMINE-PYRIDOXINE 10-10 MG PO TBEC: 2.0000 | DELAYED_RELEASE_TABLET | Freq: Every day | ORAL | 5 refills | Status: DC

## 2020-09-30 NOTE — Progress Notes (Signed)
Routine Prenatal Care Visit  Subjective  Sara [name] is a 36 y.o. [phone] at [redacted]w[redacted]d being seen today for ongoing prenatal care.  She is currently monitored for the following issues for this low-risk pregnancy and has Supervision of other normal pregnancy, antepartum on their problem list.  ----------------------------------------------------------------------------------- Patient reports persistent N/V. Patient states some days she is "ok," reports no vomiting. But states three days ago she experienced persistent emesis from 7a-2a. Patient has been trying zofran. States diclegis worked well for her in previous pregnancies..    Sara [name]. Bleeding: None.   . Denies leaking of fluid.  ----------------------------------------------------------------------------------- The following portions of the patient's history were reviewed and updated as appropriate: allergies, current medications, past family history, past medical history, past social history, past surgical history and problem list. Problem list updated.   Objective  Blood pressure 100/60, weight 145 lb (65.8 kg), last menstrual period 06/27/2020. Pregravid weight 132 lb (59.9 kg) Total Weight Gain 13 lb (5.897 kg) Urinalysis:      Fetal Status: Fetal Heart Rate (bpm): 160         General:  Alert, oriented and cooperative. Patient is in no acute distress.  Skin: Skin is warm and dry. No rash noted.   Cardiovascular: Normal heart rate noted  Respiratory: Normal respiratory effort, no problems with respiration noted  Abdomen: Soft, gravid, appropriate for gestational age. Pain/Pressure: Absent     Pelvic:  Cervical exam deferred        Extremities: Normal range of motion.     ental Status: Normal mood and affect. Normal behavior. Normal judgment and thought content.     Assessment   36 y.o. [id number] at [redacted]w[redacted]d by  04/03/2021, by Last Menstrual Period presenting for routine prenatal visit  Plan   FOURTH Problems (from 08/15/20 to  present)    Problem Noted Resolved   Supervision of other normal pregnancy, antepartum 08/15/2020 by Sara [name], CNM No   Overview Addendum 09/30/2020 10:20 AM by Sara [name], CNM      Nursing Staff Provider  Office Location  [facility] Dating   LMP = 9w [place]  Language  English Anatomy US    Flu Vaccine   declined Genetic Screen  NIPS:  Neg x3, XX  TDaP vaccine    Hgb A1C or  GTT Third trimester :   Rhogam   n/a   LAB RESULTS   Feeding Plan  breast Blood Type O/Positive/-- (01/07 1455)   Contraception  Antibody Negative (01/07 1455)  Circumcision  Rubella 1.81 (01/07 1455)  Pediatrician   RPR Non Reactive (01/07 1455)   Support Person  HBsAg Negative (01/07 1455)   Prenatal Classes  HIV Non Reactive (01/07 1455)    Varicella immune  BTL Consent  n/a GBS  (For PCN allergy, check sensitivities)        VBAC Consent  n/a Pap  08/15/20 - NILM/ + HR HPV        Hgb Electro      CF      SMA               Previous Version      -Diclegis rx'd for N/V.  First trimester precautions including but not limited to vaginal bleeding, contractions, leaking of fluid and fetal movement were reviewed in detail with the patient.    Return in about 5 weeks (around 11/04/2020) for ROB with anat scan.  Sara [name], CNM, MSN [facility], [facility] 09/30/2020,  10:21 AM

## 2020-11-07 ENCOUNTER — Telehealth: Payer: Medicaid Other | Admitting: Physician Assistant

## 2020-11-07 DIAGNOSIS — J069 Acute upper respiratory infection, unspecified: Secondary | ICD-10-CM

## 2020-11-07 DIAGNOSIS — Z3490 Encounter for supervision of normal pregnancy, unspecified, unspecified trimester: Secondary | ICD-10-CM

## 2020-11-07 NOTE — Progress Notes (Signed)
For the safety of you and your child, I recommend a face to face office visit with a health care provider.  Many mothers need to take medicines during their pregnancy and while nursing.  Almost all medicines pass into the breast milk in small quantities.  Most are generally considered safe for a mother to take but some medicines must be avoided.  After reviewing your E-Visit request, I recommend that you consult your OB/GYN or pediatrician for medical advice in relation to your condition and prescription medications while pregnant or breastfeeding. NOTE: If you entered your credit card information for this eVisit, you will not be charged. You may see a "hold" on your card for the $35 but that hold will drop off and you will not have a charge processed.   I do recommend that while you are trying to get an evaluation with them that you keep hydrated, get plenty of rest and start a saline nasal rinse. Make sure you are continuing to take your prenatal vitamins. I do recommend COVID testing to be cautious giving your pregnancy status. You can take tylenol for fever, headache or body aches.  Please consult with your GYN or pharmacist before taking any other OTC medications.   If you are having a true medical emergency please call 911.     For an urgent face to face visit, [place] has four urgent care centers for your convenience:    NEW:  [facility] [phone] [address] [place], [place] [address] .  Monday - Friday 10 am - 6 pm     . [facility]    [phone]                  Get Driving Directions  [address] [place], [place] [address] . 10 am to 8 pm Monday-Friday . 12 pm to 8 pm Saturday-Sunday   . [facility] at [facility]  [phone]                  Get Driving Directions  [address] [place], [place] [address] . 8 am to 8 pm Monday-Friday . 9 am to 6 pm  Saturday . 11 am to 6 pm Sunday   . [facility] at [facility]  [phone]                  Get Driving Directions   [address] [place], [place] [address] . 8 am to 8 pm Monday-Friday . 8 am to 4 pm Saturday-Sunday    . [facility] at [facility] Directions  [phone]  [address], [address] [place], [place] [address]  . Monday-Friday, 12 PM to 6 PM    Your e-visit answers were reviewed by a board certified advanced clinical practitioner to complete your personal care plan.  Thank you for using e-Visits.

## 2020-11-08 ENCOUNTER — Telehealth: Payer: Self-pay

## 2020-11-08 NOTE — Telephone Encounter (Signed)
Left a message for the patient to return the call.  

## 2020-11-11 ENCOUNTER — Encounter: Payer: Medicaid Other | Admitting: Obstetrics and Gynecology

## 2020-11-11 ENCOUNTER — Ambulatory Visit: Payer: Medicaid Other

## 2020-11-11 DIAGNOSIS — Z3689 Encounter for other specified antenatal screening: Secondary | ICD-10-CM

## 2020-11-23 ENCOUNTER — Other Ambulatory Visit: Payer: Self-pay

## 2020-11-23 ENCOUNTER — Ambulatory Visit
Admission: RE | Admit: 2020-11-23 | Discharge: 2020-11-23 | Disposition: A | Discharge status: Home / Self Care | Payer: Medicaid Other | Source: Ambulatory Visit | Attending: Obstetrics and Gynecology | Admitting: Obstetrics and Gynecology

## 2020-11-23 DIAGNOSIS — Z3689 Encounter for other specified antenatal screening: Secondary | ICD-10-CM | POA: Diagnosis not present

## 2020-11-24 ENCOUNTER — Ambulatory Visit (INDEPENDENT_AMBULATORY_CARE_PROVIDER_SITE_OTHER): Payer: Medicaid Other | Admitting: Obstetrics

## 2020-11-24 VITALS — BP 100/58 | Wt 164.0 lb

## 2020-11-24 DIAGNOSIS — Z362 Encounter for other antenatal screening follow-up: Secondary | ICD-10-CM

## 2020-11-24 DIAGNOSIS — Z3481 Encounter for supervision of other normal pregnancy, first trimester: Secondary | ICD-10-CM

## 2020-11-24 DIAGNOSIS — Z3A24 24 weeks gestation of pregnancy: Secondary | ICD-10-CM

## 2020-11-24 DIAGNOSIS — Z3A21 21 weeks gestation of pregnancy: Secondary | ICD-10-CM

## 2020-11-24 LAB — POCT URINALYSIS DIPSTICK OB
Glucose, UA: NEGATIVE
POC,PROTEIN,UA: NEGATIVE

## 2020-11-24 NOTE — Progress Notes (Signed)
  Routine Prenatal Care Visit  Subjective  Sara [name] is a 35 y.o. [phone] at [redacted]w[redacted]d being seen today for ongoing prenatal care.  She is currently monitored for the following issues for this high-risk pregnancy and has Supervision of other normal pregnancy, antepartum on their problem list.  ----------------------------------------------------------------------------------- Patient reports no complaints.   Contractions: Not present. Vag. Bleeding: None.  Movement: Present. Leaking Fluid denies.  ----------------------------------------------------------------------------------- The following portions of the patient's history were reviewed and updated as appropriate: allergies, current medications, past family history, past medical history, past social history, past surgical history and problem list. Problem list updated.  Objective  Blood pressure (!) 100/58, weight 164 lb (74.4 kg), last menstrual period 06/27/2020. Pregravid weight 132 lb (59.9 kg) Total Weight Gain 32 lb (14.5 kg) Urinalysis: Urine Protein Negative  Urine Glucose Negative  Fetal Status:     Movement: Present     General:  Alert, oriented and cooperative. Patient is in no acute distress.  Skin: Skin is warm and dry. No rash noted.   Cardiovascular: Normal heart rate noted  Respiratory: Normal respiratory effort, no problems with respiration noted  Abdomen: Soft, gravid, appropriate for gestational age. Pain/Pressure: Absent     Pelvic:  Cervical exam deferred        Extremities: Normal range of motion.     Mental Status: Normal mood and affect. Normal behavior. Normal judgment and thought content.   Assessment   35 y.o. [id number] at [redacted]w[redacted]d by  04/03/2021, by Last Menstrual Period presenting for routine prenatal visit  Plan   FOURTH Problems (from 08/15/20 to present)    Problem Noted Resolved   Supervision of other normal pregnancy, antepartum 08/15/2020 by Mirna Mires, CNM No   Overview Addendum 09/30/2020  10:20 AM by Zipporah Plants, CNM      Nursing Staff Provider  Office Location  [facility] Dating   LMP = 9w [place]  Language  English Anatomy US    Flu Vaccine   declined Genetic Screen  NIPS:  Neg x3, XX  TDaP vaccine    Hgb A1C or  GTT Third trimester :   Rhogam   n/a   LAB RESULTS   Feeding Plan  breast Blood Type O/Positive/-- (01/07 1455)   Contraception  Antibody Negative (01/07 1455)  Circumcision  Rubella 1.81 (01/07 1455)  Pediatrician   RPR Non Reactive (01/07 1455)   Support Person  HBsAg Negative (01/07 1455)   Prenatal Classes  HIV Non Reactive (01/07 1455)    Varicella immune  BTL Consent  n/a GBS  (For PCN allergy, check sensitivities)        VBAC Consent  n/a Pap  08/15/20 - NILM/ + HR HPV        Hgb Electro      CF      SMA               Previous Version       Preterm labor symptoms and general obstetric precautions including but not limited to vaginal bleeding, contractions, leaking of fluid and fetal movement were reviewed in detail with the patient. Please refer to After Visit Summary for other counseling recommendations.  We reviewed her anatomy ultrasound report today.She will have a repeat to view the Out tracts  Return in about 4 weeks (around 12/22/2020) for return OB.  Mirna Mires, CNM  11/24/2020 2:33 PM

## 2020-11-24 NOTE — Progress Notes (Signed)
Anataomy scan 11/23/20 at [facility]. No complaints.

## 2020-12-15 ENCOUNTER — Other Ambulatory Visit: Payer: Self-pay

## 2020-12-15 ENCOUNTER — Ambulatory Visit (INDEPENDENT_AMBULATORY_CARE_PROVIDER_SITE_OTHER): Payer: Medicaid Other | Admitting: Obstetrics

## 2020-12-15 ENCOUNTER — Ambulatory Visit (INDEPENDENT_AMBULATORY_CARE_PROVIDER_SITE_OTHER): Payer: Medicaid Other

## 2020-12-15 VITALS — BP 100/60 | Wt 171.0 lb

## 2020-12-15 DIAGNOSIS — Z3A24 24 weeks gestation of pregnancy: Secondary | ICD-10-CM

## 2020-12-15 DIAGNOSIS — Z3A28 28 weeks gestation of pregnancy: Secondary | ICD-10-CM

## 2020-12-15 DIAGNOSIS — Z362 Encounter for other antenatal screening follow-up: Secondary | ICD-10-CM

## 2020-12-15 DIAGNOSIS — Z3481 Encounter for supervision of other normal pregnancy, first trimester: Secondary | ICD-10-CM

## 2020-12-15 DIAGNOSIS — Z3480 Encounter for supervision of other normal pregnancy, unspecified trimester: Secondary | ICD-10-CM

## 2020-12-15 LAB — POCT URINALYSIS DIPSTICK OB
Glucose, UA: NEGATIVE
POC,PROTEIN,UA: NEGATIVE

## 2020-12-15 NOTE — Progress Notes (Signed)
  Routine Prenatal Care Visit  Subjective  Sara [name] is a 35 y.o. [phone] at [redacted]w[redacted]d being seen today for ongoing prenatal care.  She is currently monitored for the following issues for this high-risk pregnancy and has Supervision of other normal pregnancy, antepartum on their problem list.  ----------------------------------------------------------------------------------- Patient reports no complaints.  She has had a repeat anatomy scan and the sono shows normal anatomy today. Having a girl, who she has not named Contractions: Not present. Vag. Bleeding: None.  Movement: Present. Leaking Fluid denies.  ----------------------------------------------------------------------------------- The following portions of the patient's history were reviewed and updated as appropriate: allergies, current medications, past family history, past medical history, past social history, past surgical history and problem list. Problem list updated.  Objective  Blood pressure 100/60, weight 171 lb (77.6 kg), last menstrual period 06/27/2020. Pregravid weight 132 lb (59.9 kg) Total Weight Gain 39 lb (17.7 kg) Urinalysis: Urine Protein    Urine Glucose    Fetal Status:     Movement: Present     General:  Alert, oriented and cooperative. Patient is in no acute distress.  Skin: Skin is warm and dry. No rash noted.   Cardiovascular: Normal heart rate noted  Respiratory: Normal respiratory effort, no problems with respiration noted  Abdomen: Soft, gravid, appropriate for gestational age. Pain/Pressure: Absent     Pelvic:  Cervical exam deferred        Extremities: Normal range of motion.     Mental Status: Normal mood and affect. Normal behavior. Normal judgment and thought content.   Assessment   35 y.o. [id number] at [redacted]w[redacted]d by  04/03/2021, by Last Menstrual Period presenting for routine prenatal visit  Plan   FOURTH Problems (from 08/15/20 to present)    Problem Noted Resolved   Supervision of other normal  pregnancy, antepartum 08/15/2020 by Mirna Mires, CNM No   Overview Addendum 09/30/2020 10:20 AM by Zipporah Plants, CNM      Nursing Staff Provider  Office Location  [facility] Dating   LMP = 9w [place]  Language  English Anatomy US    Flu Vaccine   declined Genetic Screen  NIPS:  Neg x3, XX  TDaP vaccine    Hgb A1C or  GTT Third trimester :   Rhogam   n/a   LAB RESULTS   Feeding Plan  breast Blood Type O/Positive/-- (01/07 1455)   Contraception  Antibody Negative (01/07 1455)  Circumcision  Rubella 1.81 (01/07 1455)  Pediatrician   RPR Non Reactive (01/07 1455)   Support Person  HBsAg Negative (01/07 1455)   Prenatal Classes  HIV Non Reactive (01/07 1455)    Varicella immune  BTL Consent  n/a GBS  (For PCN allergy, check sensitivities)        VBAC Consent  n/a Pap  08/15/20 - NILM/ + HR HPV        Hgb Electro      CF      SMA               Previous Version       Preterm labor symptoms and general obstetric precautions including but not limited to vaginal bleeding, contractions, leaking of fluid and fetal movement were reviewed in detail with the patient. Please refer to After Visit Summary for other counseling recommendations.   Return in about 4 weeks (around 01/12/2021) for return OB, 28 week labs.  Mirna Mires, CNM  12/15/2020 3:23 PM

## 2020-12-15 NOTE — Addendum Note (Signed)
Addended by: Cornelius Moras D on: 12/15/2020 03:32 PM   Modules accepted: Orders

## 2020-12-22 ENCOUNTER — Encounter: Payer: Medicaid Other | Admitting: Obstetrics

## 2021-01-12 ENCOUNTER — Encounter: Payer: Self-pay | Admitting: Obstetrics & Gynecology

## 2021-01-12 ENCOUNTER — Other Ambulatory Visit: Payer: Self-pay

## 2021-01-12 ENCOUNTER — Ambulatory Visit (INDEPENDENT_AMBULATORY_CARE_PROVIDER_SITE_OTHER): Payer: Medicaid Other | Admitting: Obstetrics & Gynecology

## 2021-01-12 ENCOUNTER — Other Ambulatory Visit: Payer: Medicaid Other

## 2021-01-12 VITALS — BP 120/80 | Wt 169.0 lb

## 2021-01-12 DIAGNOSIS — Z3A28 28 weeks gestation of pregnancy: Secondary | ICD-10-CM

## 2021-01-12 DIAGNOSIS — Z3483 Encounter for supervision of other normal pregnancy, third trimester: Secondary | ICD-10-CM

## 2021-01-12 DIAGNOSIS — Z3480 Encounter for supervision of other normal pregnancy, unspecified trimester: Secondary | ICD-10-CM

## 2021-01-12 LAB — POCT URINALYSIS DIPSTICK OB
Glucose, UA: NEGATIVE
POC,PROTEIN,UA: NEGATIVE

## 2021-01-12 NOTE — Addendum Note (Signed)
Addended by: Cornelius Moras D on: 01/12/2021 10:34 AM   Modules accepted: Orders

## 2021-01-12 NOTE — Progress Notes (Signed)
  Subjective  Fetal Movement? yes Contractions? no Leaking Fluid? no Vaginal Bleeding? no  Objective  BP 120/80   Wt 169 lb (76.7 kg)   LMP 06/27/2020 (Exact Date)   BMI 31.93 kg/m  General: NAD Pumonary: no increased work of breathing Abdomen: gravid, non-tender Extremities: no edema Psychiatric: mood appropriate, affect full  Assessment  35 y.o. [id number] at [redacted]w[redacted]d by  04/03/2021, by Last Menstrual Period presenting for routine prenatal visit  Plan   Problem List Items Addressed This Visit      Other   Supervision of other normal pregnancy, antepartum    Other Visit Diagnoses    [redacted] weeks gestation of pregnancy    -  Primary    PNV, FMC Glucola today Plans to breast feed Uncertain plan for contraception  FOURTH Problems (from 08/15/20 to present)    Problem Noted Resolved   Supervision of other normal pregnancy, antepartum 08/15/2020 by Mirna Mires, CNM No   Overview Addendum 09/30/2020 10:20 AM by Zipporah Plants, CNM      Nursing Staff Provider  Office Location  [facility] Dating   LMP = 9w [place]  Language  English Anatomy US    Flu Vaccine   declined Genetic Screen  NIPS:  Neg x3, XX  TDaP vaccine    Hgb A1C or  GTT Third trimester :   Rhogam   n/a   LAB RESULTS   Feeding Plan  breast Blood Type O/Positive/-- (01/07 1455)   Contraception  Antibody Negative (01/07 1455)  Circumcision  Rubella 1.81 (01/07 1455)  Pediatrician   RPR Non Reactive (01/07 1455)   Support Person  HBsAg Negative (01/07 1455)   Prenatal Classes  HIV Non Reactive (01/07 1455)    Varicella immune  BTL Consent  n/a GBS  (For PCN allergy, check sensitivities)        VBAC Consent  n/a Pap  08/15/20 - NILM/ + HR HPV        Hgb Electro      CF      SMA              Annamarie Major, MD, FACOG [facility], [facility] 01/12/2021  10:32 AM

## 2021-01-12 NOTE — Patient Instructions (Signed)

## 2021-01-13 LAB — 28 WEEK RH+PANEL
Basophils Absolute: 0.1 10*3/uL (ref 0.0–0.2)
Basos: 1 %
EOS (ABSOLUTE): 0.2 10*3/uL (ref 0.0–0.4)
Eos: 2 %
Gestational Diabetes Screen: 125 mg/dL (ref 65–139)
HIV Screen 4th Generation wRfx: NONREACTIVE
Hematocrit: 37.4 % (ref 34.0–46.6)
Hemoglobin: 12.9 g/dL (ref 11.1–15.9)
Immature Grans (Abs): 0.4 10*3/uL — ABNORMAL HIGH (ref 0.0–0.1)
Immature Granulocytes: 4 %
Lymphocytes Absolute: 1.6 10*3/uL (ref 0.7–3.1)
Lymphs: 16 %
MCH: 31.8 pg (ref 26.6–33.0)
MCHC: 34.5 g/dL (ref 31.5–35.7)
MCV: 92 fL (ref 79–97)
Monocytes Absolute: 0.5 10*3/uL (ref 0.1–0.9)
Monocytes: 5 %
Neutrophils Absolute: 7.2 10*3/uL — ABNORMAL HIGH (ref 1.4–7.0)
Neutrophils: 72 %
Platelets: 286 10*3/uL (ref 150–450)
RBC: 4.06 x10E6/uL (ref 3.77–5.28)
RDW: 12.2 % (ref 11.7–15.4)
RPR Ser Ql: NONREACTIVE
WBC: 9.9 10*3/uL (ref 3.4–10.8)

## 2021-01-26 ENCOUNTER — Ambulatory Visit (INDEPENDENT_AMBULATORY_CARE_PROVIDER_SITE_OTHER): Payer: Medicaid Other | Admitting: Obstetrics and Gynecology

## 2021-01-26 ENCOUNTER — Other Ambulatory Visit: Payer: Self-pay

## 2021-01-26 DIAGNOSIS — Z7185 Encounter for immunization safety counseling: Secondary | ICD-10-CM

## 2021-01-26 DIAGNOSIS — Z3A30 30 weeks gestation of pregnancy: Secondary | ICD-10-CM

## 2021-01-26 DIAGNOSIS — Z3480 Encounter for supervision of other normal pregnancy, unspecified trimester: Secondary | ICD-10-CM

## 2021-01-26 DIAGNOSIS — Z23 Encounter for immunization: Secondary | ICD-10-CM

## 2021-01-26 LAB — POCT URINALYSIS DIPSTICK OB
Glucose, UA: NEGATIVE
POC,PROTEIN,UA: NEGATIVE

## 2021-01-26 NOTE — Addendum Note (Signed)
Addended by: Fortunato Curling R on: 01/26/2021 09:03 AM   Modules accepted: Orders

## 2021-01-26 NOTE — Progress Notes (Signed)
Routine Prenatal Care Visit  Subjective  Sara [name] is a 35 y.o. [phone] at [redacted]w[redacted]d being seen today for ongoing prenatal care.  She is currently monitored for the following issues for this low-risk pregnancy and has Supervision of other normal pregnancy, antepartum on their problem list.  ----------------------------------------------------------------------------------- Patient reports tingling/numbness in L hand and wrist. She reports she notices it more at night time. Additionally, she reports intermittent bilateral lower abdominal pain/pulling sensation. .   Contractions: Not present. Vag. Bleeding: None.  Movement: Present. Denies leaking of fluid.  ----------------------------------------------------------------------------------- The following portions of the patient's history were reviewed and updated as appropriate: allergies, current medications, past family history, past medical history, past social history, past surgical history and problem list. Problem list updated.   Objective  Blood pressure 100/62, weight 174 lb (78.9 kg), last menstrual period 06/27/2020. Pregravid weight 132 lb (59.9 kg) Total Weight Gain 42 lb (19.1 kg) Urinalysis:      Fetal Status: Fetal Heart Rate (bpm): 133 Fundal Height: 29 cm Movement: Present     General:  Alert, oriented and cooperative. Patient is in no acute distress.  Skin: Skin is warm and dry. No rash noted.   Cardiovascular: Normal heart rate noted  Respiratory: Normal respiratory effort, no problems with respiration noted  Abdomen: Soft, gravid, appropriate for gestational age. Pain/Pressure: Present     Pelvic:  Cervical exam deferred        Extremities: Normal range of motion.  Edema: Trace  ental Status: Normal mood and affect. Normal behavior. Normal judgment and thought content.     Assessment   35 y.o. [id number] at [redacted]w[redacted]d by  04/03/2021, by Last Menstrual Period presenting for routine prenatal visit  Plan   FOURTH  Problems (from 08/15/20 to present)    Problem Noted Resolved   Supervision of other normal pregnancy, antepartum 08/15/2020 by Mirna Mires, CNM No   Overview Addendum 01/26/2021  8:36 AM by Zipporah Plants, CNM      Nursing Staff Provider  Office Location  [facility] Dating   LMP = 9w [place]  Language  English Anatomy US   normal, now complete for heart views  Flu Vaccine   declined Genetic Screen  NIPS:  Neg x3, XX  TDaP vaccine   01/26/21  Hgb A1C or  GTT Third trimester : 125  Rhogam   n/a   LAB RESULTS   Feeding Plan  breast Blood Type O/Positive/-- (01/07 1455)   Contraception  Antibody Negative (01/07 1455)  Circumcision  Rubella 1.81 (01/07 1455)  Pediatrician   RPR Non Reactive (01/07 1455)   Support Person  HBsAg Negative (01/07 1455)   Prenatal Classes  HIV Non Reactive (01/07 1455)    Varicella immune  BTL Consent  n/a GBS  (For PCN allergy, check sensitivities)        VBAC Consent  n/a Pap  08/15/20 - NILM/ + HR HPV        Hgb Electro      CF      SMA               Previous Version      -Reviewed round ligament pain during pregnancy -Discussed carpal tunnel in pregnancy, keeping hand in neutral position, increasing fluid intake  Preterm obstetric precautions including but not limited to vaginal bleeding, contractions, leaking of fluid and fetal movement were reviewed in detail with the patient.    Return in about 2 weeks (around 02/09/2021) for ROB.  Zipporah Plants, CNM, MSN [facility], [facility] 01/26/2021, 8:36 AM

## 2021-01-31 ENCOUNTER — Telehealth: Payer: Medicaid Other | Admitting: Nurse Practitioner

## 2021-01-31 DIAGNOSIS — R21 Rash and other nonspecific skin eruption: Secondary | ICD-10-CM

## 2021-01-31 NOTE — Progress Notes (Signed)
Based on what you shared with me it looks like you have rash,that should be evaluated in a face to face office visit. You need  To see your Ob/GYN to make sure rash is treated properly that will cause no harm to the baby.   NOTE: If you entered your credit card information for this eVisit, you will not be charged. You may see a "hold" on your card for the $35 but that hold will drop off and you will not have a charge processed.   If you are having a true medical emergency please call 911.      For an urgent face to face visit, [facility] has six urgent care centers for your convenience:     [facility] at [facility] Directions [phone] [address] [place], [place] [address] . 8 am - 4 pm Monday - Friday    [facility]) Get Driving Directions [phone] [address] [place], [place] [address] . 8 am to 8 pm Monday-Friday . 10 am to 6 pm [facility] Urgent [facility]) Get Driving Directions [phone]  [address] [place],  [place]  [address] . 8 am to 8 pm Monday-Friday . 8 am to 4 pm [facility] Urgent Care at [facility] Get Driving Directions [phone] [address], [address] [place], [place] [address] . 8 am to 8 pm Monday-Friday . 8 am to 4 pm [facility] Urgent Care at [facility] Get Driving Directions  [phone] [address] [place], [place] [address] . 8 am to 8 pm Monday-Friday . 8 am to 4 pm [facility]gent Care at [facility] Directions [phone] [address], [address] [place], [place] [address] . 8 am to 8 pm Monday-Friday . 8 am to 4 pm Saturday-Sunday     Your MyChart E-visit questionnaire answers were reviewed by a board certified advanced clinical practitioner to complete your personal care plan  based on your specific symptoms.  Thank you for using e-Visits.

## 2021-02-08 ENCOUNTER — Telehealth: Payer: Self-pay

## 2021-02-08 NOTE — Telephone Encounter (Signed)
Pt sent an appt request to be seen today instead of tomorrow b/c she is have a clear slimy d/c and cramping.  SP asked me to call and and ck on her.  Pt states she is having tightness from the belly button down about every 30 minutes; d/c is slimy not watery.  Explained diff between true ctxs and B-H ctxs; adv we do not want her to have more than four ctxs an hour; she is at the point in preg where she can start losing her mucus plug but it doesn't mean labor is coming; is also at the point where she will be having true ctxs but needs to be seen if has more that four an hour; adv to go to L&D for water breaking, pain so bad she cannot talk through it, vaginal bleeding, more that four ctxs an hour. Adv okay to wait for appt tomorrow.  Pt reassured.

## 2021-02-09 ENCOUNTER — Encounter: Payer: Medicaid Other | Admitting: Obstetrics and Gynecology

## 2021-02-15 ENCOUNTER — Other Ambulatory Visit: Payer: Self-pay

## 2021-02-15 ENCOUNTER — Ambulatory Visit (INDEPENDENT_AMBULATORY_CARE_PROVIDER_SITE_OTHER): Payer: Medicaid Other | Admitting: Obstetrics and Gynecology

## 2021-02-15 ENCOUNTER — Encounter: Payer: Self-pay | Admitting: Obstetrics and Gynecology

## 2021-02-15 VITALS — BP 128/72 | Ht 61.0 in | Wt 174.4 lb

## 2021-02-15 DIAGNOSIS — Z3480 Encounter for supervision of other normal pregnancy, unspecified trimester: Secondary | ICD-10-CM

## 2021-02-15 DIAGNOSIS — O26893 Other specified pregnancy related conditions, third trimester: Secondary | ICD-10-CM

## 2021-02-15 DIAGNOSIS — G5600 Carpal tunnel syndrome, unspecified upper limb: Secondary | ICD-10-CM

## 2021-02-15 DIAGNOSIS — O26899 Other specified pregnancy related conditions, unspecified trimester: Secondary | ICD-10-CM

## 2021-02-15 DIAGNOSIS — R102 Pelvic and perineal pain: Secondary | ICD-10-CM

## 2021-02-15 DIAGNOSIS — Z3A33 33 weeks gestation of pregnancy: Secondary | ICD-10-CM

## 2021-02-15 NOTE — Patient Instructions (Signed)
[email or web address]    Carpal Tunnel Syndrome  Carpal tunnel syndrome is a condition that causes pain, numbness, and weakness in your hand and fingers. The carpal tunnel is a narrow area located on the palm side of your wrist. Repeated wrist motion or certain diseases may cause swelling within the tunnel. This swelling pinches the main nerve in the wrist.The main nerve in the wrist is called the median nerve. What are the causes? This condition may be caused by: Repeated and forceful wrist and hand motions. Wrist injuries. Arthritis. A cyst or tumor in the carpal tunnel. Fluid buildup during pregnancy. Use of tools that vibrate. Sometimes the cause of this condition is not known. What increases the risk? The following factors may make you more likely to develop this condition: Having a job that requires you to repeatedly or forcefully move your wrist or hand or requires you to use tools that vibrate. This may include jobs that involve using computers, working on an [employer], or working with power tools such as Radiographer, therapeutic. Being a woman. Having certain conditions, such as: Diabetes. Obesity. An underactive thyroid (hypothyroidism). Kidney failure. Rheumatoid arthritis. What are the signs or symptoms? Symptoms of this condition include: A tingling feeling in your fingers, especially in your thumb, index, and middle fingers. Tingling or numbness in your hand. An aching feeling in your entire arm, especially when your wrist and elbow are bent for a long time. Wrist pain that goes up your arm to your shoulder. Pain that goes down into your palm or fingers. A weak feeling in your hands. You may have trouble grabbing and holding items. Your symptoms may feel worse during the night. How is this diagnosed? This condition is diagnosed with a medical history and physical exam. You may also have tests, including: Electromyogram (EMG). This test measures electrical signals sent by  your nerves into the muscles. Nerve conduction study. This test measures how well electrical signals pass through your nerves. Imaging tests, such as X-rays, ultrasound, and MRI. These tests check for possible causes of your condition. How is this treated? This condition may be treated with: Lifestyle changes. It is important to stop or change the activity that caused your condition. Doing exercise and activities to strengthen and stretch your muscles and tendons (physical therapy). Making lifestyle changes to help with your condition and learning how to do your daily activities safely (occupational therapy). Medicines for pain and inflammation. This may include medicine that is injected into your wrist. A wrist splint or brace. Surgery. Follow these instructions at home: If you have a splint or brace: Wear the splint or brace as told by your health care provider. Remove it only as told by your health care provider. Loosen the splint or brace if your fingers tingle, become numb, or turn cold and blue. Keep the splint or brace clean. If the splint or brace is not waterproof: Do not let it get wet. Cover it with a watertight covering when you take a bath or shower. Managing pain, stiffness, and swelling If directed, put ice on the painful area. To do this: If you have a removeable splint or brace, remove it as told by your health care provider. Put ice in a plastic bag. Place a towel between your skin and the bag or between the splint or brace and the bag. Leave the ice on for 20 minutes, 2-3 times a day. Do not fall asleep with the cold pack on your skin. Remove the ice if  your skin turns bright red. This is very important. If you cannot feel pain, heat, or cold, you have a greater risk of damage to the area. Move your fingers often to reduce stiffness and swelling. General instructions Take over-the-counter and prescription medicines only as told by your health care provider. Rest your  wrist and hand from any activity that may be causing your pain. If your condition is work related, talk with your employer about changes that can be made, such as getting a wrist pad to use while typing. Do any exercises as told by your health care provider, physical therapist, or occupational therapist. Keep all follow-up visits. This is important. Contact a health care provider if: You have new symptoms. Your pain is not controlled with medicines. Your symptoms get worse. Get help right away if: You have severe numbness or tingling in your wrist or hand. Summary Carpal tunnel syndrome is a condition that causes pain, numbness, and weakness in your hand and fingers. It is usually caused by repeated wrist motions. Lifestyle changes and medicines are used to treat carpal tunnel syndrome. Surgery may be recommended. Follow your health care provider's instructions about wearing a splint, resting from activity, keeping follow-up visits, and calling for help. This information is not intended to replace advice given to you by your health care provider. Make sure you discuss any questions you have with your healthcare provider. Document Revised: 12/24/2019 Document Reviewed: 12/24/2019 Elsevier Patient Education  2022 [employer].

## 2021-02-15 NOTE — Progress Notes (Signed)
Routine Prenatal Care Visit  Subjective  Sara [name] is a 35 y.o. [phone] at [redacted]w[redacted]d being seen today for ongoing prenatal care.  She is currently monitored for the following issues for this low-risk pregnancy and has Supervision of other normal pregnancy, antepartum on their problem list.  ----------------------------------------------------------------------------------- Patient reports  pelvi pain and pressure. Also having numbness in thumb, pointer, and middle finger.  .   Contractions: Irritability. Vag. Bleeding: None.  Movement: Present. Denies leaking of fluid.  ----------------------------------------------------------------------------------- The following portions of the patient's history were reviewed and updated as appropriate: allergies, current medications, past family history, past medical history, past social history, past surgical history and problem list. Problem list updated.   Objective  Blood pressure 128/72, height 5\' 1"  (1.549 m), weight 174 lb 6.4 oz (79.1 kg), last menstrual period 06/27/2020. Pregravid weight 132 lb (59.9 kg) Total Weight Gain 42 lb 6.4 oz (19.2 kg) Urinalysis:      Fetal Status: Fetal Heart Rate (bpm): 135 Fundal Height: 33 cm Movement: Present  Presentation: Vertex  General:  Alert, oriented and cooperative. Patient is in no acute distress.  Skin: Skin is warm and dry. No rash noted.   Cardiovascular: Normal heart rate noted  Respiratory: Normal respiratory effort, no problems with respiration noted  Abdomen: Soft, gravid, appropriate for gestational age. Pain/Pressure: Present     Pelvic:  Cervical exam performed Dilation: 1 Effacement (%): 50 Station: -3  Extremities: Normal range of motion.  Edema: Trace  Mental Status: Normal mood and affect. Normal behavior. Normal judgment and thought content.     Assessment   35 y.o. G4P3003 at [redacted]w[redacted]d by  04/03/2021, by Last Menstrual Period presenting for routine prenatal visit  Plan    FOURTH Problems (from 08/15/20 to present)     Problem Noted Resolved   Supervision of other normal pregnancy, antepartum 08/15/2020 by 08/17/2020, CNM No   Overview Addendum 01/26/2021  8:36 AM by 03/28/2021, CNM      Nursing Staff Provider  Office Location  [facility] Dating   LMP = 9w Zipporah Plants  Language  English Anatomy US   normal, now complete for heart views  Flu Vaccine   declined Genetic Screen  NIPS:  Neg x3, XX  TDaP vaccine   01/26/21  Hgb A1C or  GTT Third trimester : 125  Rhogam   n/a   LAB RESULTS   Feeding Plan  breast Blood Type O/Positive/-- (01/07 1455)   Contraception  Antibody Negative (01/07 1455)  Circumcision  Rubella 1.81 (01/07 1455)  Pediatrician   RPR Non Reactive (01/07 1455)   Support Person  HBsAg Negative (01/07 1455)   Prenatal Classes  HIV Non Reactive (01/07 1455)    Varicella immune  BTL Consent  n/a GBS  (For PCN allergy, check sensitivities)        VBAC Consent  n/a Pap  08/15/20 - NILM/ + HR HPV        Hgb Electro      CF      SMA                     3cm x 3 cm pocket seen- repeat 08/17/20 next week to check fluid level. Overall AFI 4.5 cm.   Referral to PT for pelvic pain. Referral to orthopedics for pain and numbness in fingers.  Gestational age appropriate obstetric precautions including but not limited to vaginal bleeding, contractions, leaking of fluid and fetal movement were reviewed  in detail with the patient.    Return in about 2 weeks (around 03/01/2021) for ROB in person.  Natale Milch MD [facility], [facility] 02/15/2021, 5:51 PM

## 2021-02-22 ENCOUNTER — Encounter: Payer: Medicaid Other | Admitting: Obstetrics and Gynecology

## 2021-02-23 ENCOUNTER — Encounter: Payer: Medicaid Other | Admitting: Obstetrics

## 2021-03-02 ENCOUNTER — Ambulatory Visit: Payer: Medicaid Other | Admitting: Orthopaedic Surgery

## 2021-03-06 ENCOUNTER — Encounter: Payer: Self-pay | Admitting: Obstetrics and Gynecology

## 2021-03-06 ENCOUNTER — Other Ambulatory Visit: Payer: Self-pay

## 2021-03-06 ENCOUNTER — Ambulatory Visit (INDEPENDENT_AMBULATORY_CARE_PROVIDER_SITE_OTHER): Payer: Medicaid Other | Admitting: Obstetrics and Gynecology

## 2021-03-06 VITALS — BP 120/80 | Wt 176.0 lb

## 2021-03-06 DIAGNOSIS — Z3483 Encounter for supervision of other normal pregnancy, third trimester: Secondary | ICD-10-CM

## 2021-03-06 DIAGNOSIS — Z3A36 36 weeks gestation of pregnancy: Secondary | ICD-10-CM

## 2021-03-06 LAB — POCT URINALYSIS DIPSTICK OB
Glucose, UA: NEGATIVE
POC,PROTEIN,UA: NEGATIVE

## 2021-03-06 NOTE — Progress Notes (Signed)
Routine Prenatal Care Visit  Subjective  Sara [name] is a 35 y.o. [phone] at [redacted]w[redacted]d being seen today for ongoing prenatal care.  She is currently monitored for the following issues for this low-risk pregnancy and has Supervision of other normal pregnancy, antepartum on their problem list.  ----------------------------------------------------------------------------------- Patient reports some ongoing fluid leaking.  Had DVP 3.5 cm 3 weeks ago.    Contractions: Irregular. Vag. Bleeding: None.  Movement: Present. Leaking Fluid reports some possible fluid leaking.  ----------------------------------------------------------------------------------- The following portions of the patient's history were reviewed and updated as appropriate: allergies, current medications, past family history, past medical history, past social history, past surgical history and problem list. Problem list updated.  Objective  Blood pressure 120/80, weight 176 lb (79.8 kg), last menstrual period 06/27/2020. Pregravid weight 132 lb (59.9 kg) Total Weight Gain 44 lb (20 kg) Urinalysis: Urine Protein    Urine Glucose    Fetal Status: Fetal Heart Rate (bpm): 130 Fundal Height: 36 cm Movement: Present  Presentation: Vertex (by U/S)  General:  Alert, oriented and cooperative. Patient is in no acute distress.  Skin: Skin is warm and dry. No rash noted.   Cardiovascular: Normal heart rate noted  Respiratory: Normal respiratory effort, no problems with respiration noted  Abdomen: Soft, gravid, appropriate for gestational age. Pain/Pressure: Present     Pelvic:  Cervical exam performed Dilation: 2.5 Effacement (%): 50 Station: -3  Extremities: Normal range of motion.  Edema: None  Mental Status: Normal mood and affect. Normal behavior. Normal judgment and thought content.   Female chaperone present for pelvic exam:   U/S: Cephalic, DVP 2.5 x 2 cm. Placenta anterior.   ROM w/u: Fern: negative Pooling:  negative Nitrazine: negative  Assessment   35 y.o. [id number] at [redacted]w[redacted]d by  04/03/2021, by Last Menstrual Period presenting for routine prenatal visit  Plan   FOURTH Problems (from 08/15/20 to present)     Problem Noted Resolved   Supervision of other normal pregnancy, antepartum 08/15/2020 by Mirna Mires, CNM No   Overview Addendum 01/26/2021  8:36 AM by Zipporah Plants, CNM      Nursing Staff Provider  Office Location  [facility] Dating   LMP = 9w [place]  Language  English Anatomy US   normal, now complete for heart views  Flu Vaccine   declined Genetic Screen  NIPS:  Neg x3, XX  TDaP vaccine   01/26/21  Hgb A1C or  GTT Third trimester : 125  Rhogam   n/a   LAB RESULTS   Feeding Plan  breast Blood Type O/Positive/-- (01/07 1455)   Contraception  Antibody Negative (01/07 1455)  Circumcision  Rubella 1.81 (01/07 1455)  Pediatrician   RPR Non Reactive (01/07 1455)   Support Person  HBsAg Negative (01/07 1455)   Prenatal Classes  HIV Non Reactive (01/07 1455)    Varicella immune  BTL Consent  n/a GBS  (For PCN allergy, check sensitivities)        VBAC Consent  n/a Pap  08/15/20 - NILM/ + HR HPV        Hgb Electro      CF      SMA                     Preterm labor symptoms and general obstetric precautions including but not limited to vaginal bleeding, contractions, leaking of fluid and fetal movement were reviewed in detail with the patient. Please refer to After Visit Summary for other  counseling recommendations.   - discussed ROM and to go to L&D if she thinks her membranes have ruptured given better testing, if leak is chronic. She voiced understanding. - no evidence of ROM today - GBS today  Return in about 1 week (around 03/13/2021) for Routine Prenatal Appointment.   Thomasene Mohair, MD, [facility], [facility] 03/06/2021 9:08 AM

## 2021-03-06 NOTE — Addendum Note (Signed)
Addended by: Cornelius Moras D on: 03/06/2021 09:12 AM   Modules accepted: Orders

## 2021-03-08 ENCOUNTER — Encounter: Payer: Medicaid Other | Admitting: Obstetrics and Gynecology

## 2021-03-08 ENCOUNTER — Observation Stay
Admission: EM | Admit: 2021-03-08 | Discharge: 2021-03-08 | Disposition: A | Discharge status: Home / Self Care | Payer: Medicaid Other | Attending: Obstetrics and Gynecology | Admitting: Obstetrics and Gynecology

## 2021-03-08 ENCOUNTER — Other Ambulatory Visit: Payer: Self-pay

## 2021-03-08 ENCOUNTER — Encounter: Payer: Self-pay | Admitting: Obstetrics and Gynecology

## 2021-03-08 DIAGNOSIS — O4290 Premature rupture of membranes, unspecified as to length of time between rupture and onset of labor, unspecified weeks of gestation: Secondary | ICD-10-CM | POA: Diagnosis present

## 2021-03-08 DIAGNOSIS — O4193X0 Disorder of amniotic fluid and membranes, unspecified, third trimester, not applicable or unspecified: Secondary | ICD-10-CM | POA: Diagnosis not present

## 2021-03-08 DIAGNOSIS — Z3A36 36 weeks gestation of pregnancy: Secondary | ICD-10-CM | POA: Diagnosis not present

## 2021-03-08 DIAGNOSIS — O4193X1 Disorder of amniotic fluid and membranes, unspecified, third trimester, fetus 1: Principal | ICD-10-CM | POA: Insufficient documentation

## 2021-03-08 LAB — URINALYSIS, COMPLETE (UACMP) WITH MICROSCOPIC
Bacteria, UA: NONE SEEN
Bilirubin Urine: NEGATIVE
Glucose, UA: NEGATIVE mg/dL
Hgb urine dipstick: NEGATIVE
Ketones, ur: 5 mg/dL — AB
Leukocytes,Ua: NEGATIVE
Nitrite: NEGATIVE
Protein, ur: 30 mg/dL — AB
Specific Gravity, Urine: 1.027 (ref 1.005–1.030)
pH: 6 (ref 5.0–8.0)

## 2021-03-08 LAB — RUPTURE OF MEMBRANE (ROM)PLUS: Rom Plus: NEGATIVE

## 2021-03-08 NOTE — Discharge Summary (Signed)
Physician Discharge Summary  Patient ID: Sara [name] MRN: [id number] DOB/AGE: Sep 26, 1985 34 y.o.  Admit date: 03/08/2021 Discharge date: 03/08/2021  Admission Diagnoses:Leakage of amniotic fluid  Discharge Diagnoses:  Active Problems:   Leakage of amniotic fluid   Discharged Condition: good  Hospital Course: Patient was evaluated for complaints of leakage of amniotic fluid. ROM plus was negative. Patient discharged home. Reactive fetal tracing  NST: 135 bpm baseline, moderate variability, 15x15 accelerations, no decelerations. Tocometer : irregular   Consults: None  Significant Diagnostic Studies: labs: See EPIC  Treatments: none  Discharge Exam: Blood pressure 120/79, pulse (!) 102, temperature 99 F (37.2 C), temperature source Oral, resp. rate 16, last menstrual period 06/27/2020. General appearance: alert and cooperative  Disposition: Discharge disposition: 01-Home or Self Care        Allergies as of 03/08/2021       Reactions   Phenergan [promethazine Hcl] Nausea And Vomiting        Medication List     TAKE these medications    Doxylamine-Pyridoxine 10-10 MG Tbec Commonly known as: Diclegis Take 2 tablets by mouth at bedtime. If symptoms persist, add one tablet in the morning and one in the afternoon   multivitamin-prenatal 27-0.8 MG Tabs tablet Take 1 tablet by mouth daily at 12 noon.         Signed: Natale Milch 03/08/2021, 11:01 PM

## 2021-03-08 NOTE — OB Triage Note (Signed)
Pt is G4P3 at 36.2weeks with c/o leaking of fluid and swelling. No problems with BP or GDM or PTL during this pregnancy. Pt reports fetal movement but states it doesn't seem as much as usual

## 2021-03-09 LAB — STREP GP B NAA: Strep Gp B NAA: POSITIVE — AB

## 2021-03-13 ENCOUNTER — Ambulatory Visit (INDEPENDENT_AMBULATORY_CARE_PROVIDER_SITE_OTHER): Payer: Medicaid Other | Admitting: Obstetrics

## 2021-03-13 ENCOUNTER — Other Ambulatory Visit: Payer: Self-pay

## 2021-03-13 VITALS — BP 118/70 | Wt 169.0 lb

## 2021-03-13 DIAGNOSIS — Z3A37 37 weeks gestation of pregnancy: Secondary | ICD-10-CM

## 2021-03-13 DIAGNOSIS — Z3480 Encounter for supervision of other normal pregnancy, unspecified trimester: Secondary | ICD-10-CM

## 2021-03-13 NOTE — Progress Notes (Signed)
  Routine Prenatal Care Visit  Subjective  Sara [name] is a 35 y.o. [phone] at [redacted]w[redacted]d being seen today for ongoing prenatal care.  She is currently monitored for the following issues for this low-risk pregnancy and has Supervision of other normal pregnancy, antepartum and Leakage of amniotic fluid on their problem list.  ----------------------------------------------------------------------------------- Patient reports no complaints.    .  .   Sara [name] Fluid denies.  ----------------------------------------------------------------------------------- The following portions of the patient's history were reviewed and updated as appropriate: allergies, current medications, past family history, past medical history, past social history, past surgical history and problem list. Problem list updated.  Objective  Blood pressure 118/70, weight 169 lb (76.7 kg), last menstrual period 06/27/2020. Pregravid weight 132 lb (59.9 kg) Total Weight Gain 37 lb (16.8 kg) Urinalysis: Urine Protein    Urine Glucose    Fetal Status:           General:  Alert, oriented and cooperative. Patient is in no acute distress.  Skin: Skin is warm and dry. No rash noted.   Cardiovascular: Normal heart rate noted  Respiratory: Normal respiratory effort, no problems with respiration noted  Abdomen: Soft, gravid, appropriate for gestational age.       Pelvic:  Cervical exam performed      3cms/50%/-3. Cervix is quite anterior  Extremities: Normal range of motion.     Mental Status: Normal mood and affect. Normal behavior. Normal judgment and thought content.   Assessment   35 y.o. [id number] at [redacted]w[redacted]d by  04/03/2021, by Last Menstrual Period presenting for routine prenatal visit She is GBS positive.  Plan   FOURTH Problems (from 08/15/20 to present)    Problem Noted Resolved   Supervision of other normal pregnancy, antepartum 08/15/2020 by Sara [name], CNM No   Overview Addendum 01/26/2021  8:36 AM by Sara [name], CNM      Nursing Staff Provider  Office Location  [facility] Dating   LMP = 9w [place]  Language  English Anatomy US   normal, now complete for heart views  Flu Vaccine   declined Genetic Screen  NIPS:  Neg x3, XX  TDaP vaccine   01/26/21  Hgb A1C or  GTT Third trimester : 125  Rhogam   n/a   LAB RESULTS   Feeding Plan  breast Blood Type O/Positive/-- (01/07 1455)   Contraception  Antibody Negative (01/07 1455)  Circumcision  Rubella 1.81 (01/07 1455)  Pediatrician   RPR Non Reactive (01/07 1455)   Support Person  HBsAg Negative (01/07 1455)   Prenatal Classes  HIV Non Reactive (01/07 1455)    Varicella immune  BTL Consent  n/a GBS  (For PCN allergy, check sensitivities)        VBAC Consent  n/a Pap  08/15/20 - NILM/ + HR HPV        Hgb Electro      CF      SMA                   Term labor symptoms and general obstetric precautions including but not limited to vaginal bleeding, contractions, leaking of fluid and fetal movement were reviewed in detail with the patient. Please refer to After Visit Summary for other counseling recommendations.   Return in about 1 week (around 03/20/2021) for return OB.  Sara [name], CNM  03/13/2021 9:11 AM

## 2021-03-13 NOTE — Progress Notes (Signed)
ROB- cervix check, no concerns 

## 2021-03-14 ENCOUNTER — Other Ambulatory Visit: Payer: Self-pay

## 2021-03-14 ENCOUNTER — Inpatient Hospital Stay
Admission: EM | Admit: 2021-03-14 | Discharge: 2021-03-16 | DRG: 807 | Disposition: A | Discharge status: Home / Self Care | Payer: Medicaid Other | Attending: Obstetrics and Gynecology | Admitting: Obstetrics and Gynecology

## 2021-03-14 ENCOUNTER — Encounter: Payer: Self-pay | Admitting: Obstetrics and Gynecology

## 2021-03-14 DIAGNOSIS — O99824 Streptococcus B carrier state complicating childbirth: Secondary | ICD-10-CM | POA: Diagnosis present

## 2021-03-14 DIAGNOSIS — Z880 Allergy status to penicillin: Secondary | ICD-10-CM | POA: Diagnosis not present

## 2021-03-14 DIAGNOSIS — Z3480 Encounter for supervision of other normal pregnancy, unspecified trimester: Secondary | ICD-10-CM

## 2021-03-14 DIAGNOSIS — Z3A37 37 weeks gestation of pregnancy: Secondary | ICD-10-CM | POA: Diagnosis not present

## 2021-03-14 DIAGNOSIS — B951 Streptococcus, group B, as the cause of diseases classified elsewhere: Secondary | ICD-10-CM | POA: Diagnosis not present

## 2021-03-14 DIAGNOSIS — Z87891 Personal history of nicotine dependence: Secondary | ICD-10-CM

## 2021-03-14 DIAGNOSIS — O98813 Other maternal infectious and parasitic diseases complicating pregnancy, third trimester: Secondary | ICD-10-CM | POA: Diagnosis not present

## 2021-03-14 DIAGNOSIS — Z20822 Contact with and (suspected) exposure to covid-19: Secondary | ICD-10-CM | POA: Diagnosis present

## 2021-03-14 DIAGNOSIS — O26893 Other specified pregnancy related conditions, third trimester: Secondary | ICD-10-CM | POA: Diagnosis present

## 2021-03-14 LAB — CBC
HCT: 36.8 % (ref 36.0–46.0)
Hemoglobin: 13 g/dL (ref 12.0–15.0)
MCH: 32.2 pg (ref 26.0–34.0)
MCHC: 35.3 g/dL (ref 30.0–36.0)
MCV: 91.1 fL (ref 80.0–100.0)
Platelets: 400 10*3/uL (ref 150–400)
RBC: 4.04 MIL/uL (ref 3.87–5.11)
RDW: 14 % (ref 11.5–15.5)
WBC: 17.9 10*3/uL — ABNORMAL HIGH (ref 4.0–10.5)
nRBC: 0 % (ref 0.0–0.2)

## 2021-03-14 LAB — RESP PANEL BY RT-PCR (FLU A&B, COVID) ARPGX2
Influenza A by PCR: NEGATIVE
Influenza B by PCR: NEGATIVE
SARS Coronavirus 2 by RT PCR: NEGATIVE

## 2021-03-14 LAB — TYPE AND SCREEN
ABO/RH(D): O POS
Antibody Screen: NEGATIVE

## 2021-03-14 LAB — RPR: RPR Ser Ql: NONREACTIVE

## 2021-03-14 MED ORDER — ONDANSETRON HCL 4 MG/2ML IJ SOLN
4.0000 mg | Freq: Four times a day (QID) | INTRAMUSCULAR | Status: DC | PRN
  Administered 2021-03-14: 4 mg via INTRAVENOUS
  Filled 2021-03-14: qty 2

## 2021-03-14 MED ORDER — SENNOSIDES-DOCUSATE SODIUM 8.6-50 MG PO TABS: 2.0000 | ORAL_TABLET | Freq: Every day | ORAL | Status: DC

## 2021-03-14 MED ORDER — SODIUM CHLORIDE 0.9 % IV SOLN
5.0000 10*6.[IU] | Freq: Once | INTRAVENOUS | Status: AC
  Administered 2021-03-14: 5 10*6.[IU] via INTRAVENOUS
  Filled 2021-03-14: qty 5

## 2021-03-14 MED ORDER — ONDANSETRON HCL 4 MG PO TABS
4.0000 mg | ORAL_TABLET | ORAL | Status: DC | PRN
  Filled 2021-03-14: qty 1

## 2021-03-14 MED ORDER — LIDOCAINE HCL (PF) 1 % IJ SOLN
INTRAMUSCULAR | Status: AC
  Filled 2021-03-14: qty 30

## 2021-03-14 MED ORDER — BENZOCAINE-MENTHOL 20-0.5 % EX AERO
1.0000 "application " | INHALATION_SPRAY | CUTANEOUS | Status: DC | PRN
  Administered 2021-03-14: 1 via TOPICAL
  Filled 2021-03-14: qty 56

## 2021-03-14 MED ORDER — ACETAMINOPHEN 325 MG PO TABS: 650.0000 mg | ORAL_TABLET | ORAL | Status: DC | PRN

## 2021-03-14 MED ORDER — IBUPROFEN 600 MG PO TABS
600.0000 mg | ORAL_TABLET | Freq: Four times a day (QID) | ORAL | Status: DC
  Administered 2021-03-14: 600 mg via ORAL
  Filled 2021-03-14: qty 1

## 2021-03-14 MED ORDER — MISOPROSTOL 200 MCG PO TABS
ORAL_TABLET | ORAL | Status: AC
  Filled 2021-03-14: qty 4

## 2021-03-14 MED ORDER — OXYTOCIN BOLUS FROM INFUSION
333.0000 mL | Freq: Once | INTRAVENOUS | Status: AC
  Administered 2021-03-14: 333 mL via INTRAVENOUS

## 2021-03-14 MED ORDER — LACTATED RINGERS IV SOLN: INTRAVENOUS | Status: DC

## 2021-03-14 MED ORDER — DIPHENHYDRAMINE HCL 25 MG PO CAPS: 25.0000 mg | ORAL_CAPSULE | Freq: Four times a day (QID) | ORAL | Status: DC | PRN

## 2021-03-14 MED ORDER — PRENATAL MULTIVITAMIN CH
1.0000 | ORAL_TABLET | Freq: Every day | ORAL | Status: DC
  Administered 2021-03-14: 1 via ORAL

## 2021-03-14 MED ORDER — TETANUS-DIPHTH-ACELL PERTUSSIS 5-2.5-18.5 LF-MCG/0.5 IM SUSY: 0.5000 mL | PREFILLED_SYRINGE | Freq: Once | INTRAMUSCULAR | Status: DC

## 2021-03-14 MED ORDER — COCONUT OIL OIL
1.0000 "application " | TOPICAL_OIL | Status: DC | PRN
  Filled 2021-03-14: qty 120

## 2021-03-14 MED ORDER — LACTATED RINGERS IV SOLN: 500.0000 mL | INTRAVENOUS | Status: DC | PRN

## 2021-03-14 MED ORDER — DIBUCAINE (PERIANAL) 1 % EX OINT: 1.0000 "application " | TOPICAL_OINTMENT | CUTANEOUS | Status: DC | PRN

## 2021-03-14 MED ORDER — AMMONIA AROMATIC IN INHA
RESPIRATORY_TRACT | Status: AC
  Filled 2021-03-14: qty 10

## 2021-03-14 MED ORDER — PENICILLIN G POT IN DEXTROSE 60000 UNIT/ML IV SOLN: 3.0000 10*6.[IU] | INTRAVENOUS | Status: DC

## 2021-03-14 MED ORDER — OXYTOCIN-SODIUM CHLORIDE 30-0.9 UT/500ML-% IV SOLN
2.5000 [IU]/h | INTRAVENOUS | Status: DC
  Administered 2021-03-14: 2.5 [IU]/h via INTRAVENOUS

## 2021-03-14 MED ORDER — SIMETHICONE 80 MG PO CHEW: 80.0000 mg | CHEWABLE_TABLET | ORAL | Status: DC | PRN

## 2021-03-14 MED ORDER — OXYTOCIN-SODIUM CHLORIDE 30-0.9 UT/500ML-% IV SOLN
INTRAVENOUS | Status: AC
  Filled 2021-03-14: qty 500

## 2021-03-14 MED ORDER — LIDOCAINE HCL (PF) 1 % IJ SOLN
30.0000 mL | INTRAMUSCULAR | Status: DC | PRN
Start: 2021-03-14 — End: 2021-03-14

## 2021-03-14 MED ORDER — BUTORPHANOL TARTRATE 1 MG/ML IJ SOLN: 1.0000 mg | INTRAMUSCULAR | Status: DC | PRN

## 2021-03-14 MED ORDER — ONDANSETRON HCL 4 MG/2ML IJ SOLN: 4.0000 mg | INTRAMUSCULAR | Status: DC | PRN

## 2021-03-14 MED ORDER — WITCH HAZEL-GLYCERIN EX PADS: 1.0000 "application " | MEDICATED_PAD | CUTANEOUS | Status: DC | PRN

## 2021-03-14 MED ORDER — OXYTOCIN 10 UNIT/ML IJ SOLN
INTRAMUSCULAR | Status: AC
  Filled 2021-03-14: qty 2

## 2021-03-14 NOTE — Lactation Note (Signed)
This note was copied from a baby's chart. Lactation Consultation Note  Patient Name: Sara [name] OEHOZ'Y Date: 03/14/2021 Reason for consult: Initial assessment;Early term 37-38.6wks;Infant < 6lbs Age:33 hours  Initial lactation visit. Mom is G4P4, SVD 5 hours ago. Mom breastfed her last child for 6-7 months- cites baby biting as her reason for discontinuing.  1 feeding has been documented since delivery, no pain/discomfort noted. Baby has been sleeping, and did not accept last feeding attempt. LC discussed normal newborn behaviors, feeding habits and patterns, increase sleeping of 37 week infants, early feeding cues, and benefits of skin to skin.  Encouraged hand expression- mom notes she is leaking- and options for alternate feeding method if needed d/t age/size. Mom agreed.  Whiteboard updated with LC name/number, encouraged to call at next feeding attempt.  Maternal Data Has patient been taught Hand Expression?: Yes Does the patient have breastfeeding experience prior to this delivery?: Yes How long did the patient breastfeed?: 6-7 months  Feeding Mother's Current Feeding Choice: Breast Milk  LATCH Score                    Lactation Tools Discussed/Used    Interventions Interventions: Breast feeding basics reviewed;Skin to skin;Hand express;Education  Discharge Pump: Personal  Consult Status Consult Status: Follow-up Date: 03/14/21 Follow-up type: In-patient    Danford Bad 03/14/2021, 1:02 PM

## 2021-03-14 NOTE — Plan of Care (Signed)
Pt has met all expected goals of L/D prior to transfer to [facility].

## 2021-03-14 NOTE — Discharge Summary (Signed)
OB Discharge Summary     Patient Name: Sara [name] DOB: 04/07/1986 MRN: [id number]  Date of admission: 03/14/2021 Delivering provider: Tresea Mall, CNM  Date of Delivery: 03/14/2021  Date of discharge: 03/16/2021  Admitting diagnosis: Labor and delivery, indication for care [O75.9] Intrauterine pregnancy: [redacted]w[redacted]d     Secondary diagnosis: None     Discharge diagnosis: Term Pregnancy Delivered                                                                                                Post partum procedures: none  Augmentation: N/A  Complications: None  Hospital course:  Onset of Labor With Vaginal Delivery      35 y.o. yo [name] at [redacted]w[redacted]d was admitted in Active Labor on 03/14/2021. Patient had an uncomplicated labor course as follows:  Membrane Rupture Time/Date: 6:59 AM ,03/14/2021   Delivery Method:Vaginal, Spontaneous  Episiotomy: None  Lacerations:  None  Patient had an uncomplicated postpartum course.  She is tolerating regular diet, her pain is controlled with PO medications, she is ambulating and voiding without difficulty. Patient is discharged home in stable condition on 03/16/21.  Newborn Data: Birth date:03/14/2021  Birth time:7:05 AM  Gender: female [name] Living status:Living  Apgars: 8, 9 Weight: 5 pounds 10 ounces, 2540 g  Physical exam  Vitals:   03/14/21 2233 03/15/21 0818 03/15/21 1607 03/15/21 2255  BP: 124/69 113/72 106/62 114/61  Pulse: 78 75 75 78  Resp: 18 18 18 18   Temp: 98.1 F (36.7 C) 98.2 F (36.8 C) 97.7 F (36.5 C) 98.5 F (36.9 C)  TempSrc: Oral Oral Oral Oral  SpO2: 99% 100%  99%  Weight:      Height:       General: alert, cooperative, and no distress Lochia: appropriate Uterine Fundus: firm Incision: N/A DVT Evaluation: No evidence of DVT seen on physical exam. Negative Homan's sign.  Labs: Lab Results  Component Value Date   WBC 13.3 (H) 03/15/2021   HGB 10.5 (L) 03/15/2021   HCT 30.6 (L) 03/15/2021   MCV 91.9  03/15/2021   PLT 350 03/15/2021    Discharge instruction: per After Visit Summary.  Medications:  Allergies as of 03/16/2021       Reactions   Phenergan [promethazine Hcl] Nausea And Vomiting        Medication List     STOP taking these medications    Doxylamine-Pyridoxine 10-10 MG Tbec Commonly known as: Diclegis       TAKE these medications    ibuprofen 600 MG tablet Commonly known as: ADVIL Take 1 tablet (600 mg total) by mouth every 6 (six) hours.   multivitamin-prenatal 27-0.8 MG Tabs tablet Take 1 tablet by mouth daily at 12 noon.        Diet: routine diet  Activity: Advance as tolerated. Pelvic rest for 6 weeks.   Outpatient follow up:  Follow-up Information     03/18/2021, CNM. Schedule an appointment as soon as possible for a visit in 6 week(s).   Specialty: Obstetrics Why: postpartum follow up Contact information: [address] [place] [phone]  Postpartum contraception: Progesterone only pills initially Rhogam Given postpartum: NA Rubella vaccine given postpartum: immune Varicella vaccine given postpartum: immune TDaP given antepartum or postpartum: given antepartum   Newborn Delivery   Birth date/time: 03/14/2021 07:05:00 Delivery type: Vaginal, Spontaneous       Baby Feeding: Breast  Disposition:home with mother  SIGNED:  Mirna Mires, CNM 03/16/2021 8:03 AM

## 2021-03-14 NOTE — H&P (Signed)
OB History & Physical   History of Present Illness:  Chief Complaint: contractions  HPI:  Sara [name] is a 35 y.o. [phone] female at [redacted]w[redacted]d dated by LMP c/w 9 week ultrasound.  Her pregnancy has been uncomplicated.    She reports contractions that began around 2 AM.   She denies leakage of fluid.   She denies vaginal bleeding.   She reports fetal movement.    Total weight gain for pregnancy: 16.8 kg   Obstetrical Problem List: FOURTH Problems (from 08/15/20 to present)     Problem Noted Resolved   Supervision of other normal pregnancy, antepartum 08/15/2020 by Mirna Mires, CNM No   Overview Addendum 01/26/2021  8:36 AM by Zipporah Plants, CNM      Nursing Staff Provider  Office Location  [facility] Dating   LMP = 9w [place]  Language  English Anatomy US   normal, now complete for heart views  Flu Vaccine   declined Genetic Screen  NIPS:  Neg x3, XX  TDaP vaccine   01/26/21  Hgb A1C or  GTT Third trimester : 125  Rhogam   n/a   LAB RESULTS   Feeding Plan  breast Blood Type O/Positive/-- (01/07 1455)   Contraception  Antibody Negative (01/07 1455)  Circumcision  Rubella 1.81 (01/07 1455)  Pediatrician   RPR Non Reactive (01/07 1455)   Support Person  HBsAg Negative (01/07 1455)   Prenatal Classes  HIV Non Reactive (01/07 1455)    Varicella immune  BTL Consent  n/a GBS  (For PCN allergy, check sensitivities)        VBAC Consent  n/a Pap  08/15/20 - NILM/ + HR HPV        Hgb Electro      CF      SMA                    Maternal Medical History:   Past Medical History:  Diagnosis Date   Abnormal Pap smear of cervix    GERD (gastroesophageal reflux disease)    Kidney stone    Seizures (HCC)     Past Surgical History:  Procedure Laterality Date   CHOLECYSTECTOMY     lipoma removal  2013   TONSILLECTOMY  2009   TUBAL LIGATION  2014   TUBAL REVERSAL  01/2020    [facility]    Allergies  Allergen Reactions   Phenergan [Promethazine Hcl] Nausea And Vomiting     Prior to Admission medications   Medication Sig Start Date End Date Taking? Authorizing Provider  Doxylamine-Pyridoxine (DICLEGIS) 10-10 MG TBEC Take 2 tablets by mouth at bedtime. If symptoms persist, add one tablet in the morning and one in the afternoon 09/30/20  Yes Veal, Katelyn, CNM  Prenatal Vit-Fe Fumarate-FA (MULTIVITAMIN-PRENATAL) 27-0.8 MG TABS tablet Take 1 tablet by mouth daily at 12 noon.   Yes [provider]    OB History  Gravida Para Term Preterm AB Living  4 3 3     3   SAB IAB Ectopic Multiple Live Births          3    # Outcome Date GA Lbr Len/2nd Weight Sex Delivery Anes PTL Lv  4 Current           3 Term           2 Term           1 Term  Obstetric Comments  1st Menstrual Cycle: 14  1st Pregnancy:  18    Prenatal care site: [facility]  Social History: She  reports that she quit smoking about 7 months ago. Her smoking use included cigarettes. She has never used smokeless tobacco. She reports previous alcohol use. She reports that she does not use drugs.  Family History: family history includes Diabetes in her maternal grandmother; Stroke in her maternal great-grandmother; Thyroid disease in her maternal aunt.    Review of Systems:  Review of Systems  Constitutional:  Negative for chills and fever.  HENT:  Negative for congestion, ear discharge, ear pain, hearing loss, sinus pain and sore throat.   Eyes:  Negative for blurred vision and double vision.  Respiratory:  Negative for cough, shortness of breath and wheezing.   Cardiovascular:  Negative for chest pain, palpitations and leg swelling.  Gastrointestinal:  Positive for abdominal pain. Negative for blood in stool, constipation, diarrhea, heartburn, melena, nausea and vomiting.  Genitourinary:  Negative for dysuria, flank pain, frequency, hematuria and urgency.  Musculoskeletal:  Negative for back pain, joint pain and myalgias.  Skin:  Negative for itching and rash.   Neurological:  Negative for dizziness, tingling, tremors, sensory change, speech change, focal weakness, seizures, loss of consciousness, weakness and headaches.  Endo/Heme/Allergies:  Negative for environmental allergies. Does not bruise/bleed easily.  Psychiatric/Behavioral:  Negative for depression, hallucinations, memory loss, substance abuse and suicidal ideas. The patient is not nervous/anxious and does not have insomnia.     Physical Exam:  BP 126/76 (BP Location: Left Arm)   Pulse 100   Temp 98 F (36.7 C) (Oral)   Resp 16   Ht 5\' 1"  (1.549 m)   Wt 76.7 kg   LMP 06/27/2020 (Exact Date)   BMI 31.93 kg/m   Constitutional: Well nourished, well developed female in no acute distress.  HEENT: normal Skin: Warm and dry.  Cardiovascular: Regular rate and rhythm.   Extremity:  no edema   Respiratory: Clear to auscultation bilateral. Normal respiratory effort Abdomen: FHT present Back: no CVAT Neuro: DTRs 2+, Cranial nerves grossly intact Psych: Alert and Oriented x3. No memory deficits. Normal mood and affect.  MS: normal gait, normal bilateral lower extremity ROM/strength/stability.  Pelvic exam: per RN A. 13/08/2019 5/50/-2 with change to 6 cm after 1.5 hours in triage   Baseline FHR: 130 beats/min   Variability: moderate   Accelerations: present   Decelerations: absent Contractions: present frequency: every 3-5 minutes Overall assessment: reassuring    No results found for: SARSCOV2NAA] Covid test result pending  Assessment:  Sara [name] is a 35 y.o. [id number] female at [redacted]w[redacted]d with active labor.   Plan:  Admit to Labor & Delivery  CBC, T&S, Clrs, IVF GBS positive: Penicillin prophylaxis   Fetal well-being: Category I Consider AROM after 1st dose of penicillin Epidural after labs result    [redacted]w[redacted]d, [facility] 03/14/2021 6:19 AM

## 2021-03-14 NOTE — OB Triage Note (Signed)
Sara [name] is a 34yo G4P3, 37w 1d. She arrived to the unit with complaints of contractions every 3-78mins. She denies vaginal bleeding, reports positive fetal movement. VS stable, monitors applied and assessing.   Initial FHT 145 at [facility]

## 2021-03-15 LAB — CBC
HCT: 30.6 % — ABNORMAL LOW (ref 36.0–46.0)
Hemoglobin: 10.5 g/dL — ABNORMAL LOW (ref 12.0–15.0)
MCH: 31.5 pg (ref 26.0–34.0)
MCHC: 34.3 g/dL (ref 30.0–36.0)
MCV: 91.9 fL (ref 80.0–100.0)
Platelets: 350 10*3/uL (ref 150–400)
RBC: 3.33 MIL/uL — ABNORMAL LOW (ref 3.87–5.11)
RDW: 13.9 % (ref 11.5–15.5)
WBC: 13.3 10*3/uL — ABNORMAL HIGH (ref 4.0–10.5)
nRBC: 0 % (ref 0.0–0.2)

## 2021-03-15 NOTE — Progress Notes (Signed)
Post Partum Day 1 Subjective: no complaints, up ad lib, voiding, tolerating PO, and her lochia flow is very light. Little perineal tenderness. She is breastfeeding.as she was not treated for GBS, she will stay until tomorrow before being disharged.  Objective: Blood pressure 113/72, pulse 75, temperature 98.2 F (36.8 C), temperature source Oral, resp. rate 18, height 5\' 1"  (1.549 m), weight 76.7 kg, last menstrual period 06/27/2020, SpO2 100 %, unknown if currently breastfeeding.  Physical Exam:  General: alert, cooperative, and no distress Lochia: appropriate Uterine Fundus: firm Incision: intact perineum. No edema noted DVT Evaluation: No evidence of DVT seen on physical exam. Negative Homan's sign.  Recent Labs    03/14/21 0634 03/15/21 0406  HGB 13.0 10.5*  HCT 36.8 30.6*    Assessment/Plan: Plan for discharge tomorrow, Breastfeeding, and Contraception POPs likely   LOS: 1 day   03/17/21 03/15/2021, 12:04 PM

## 2021-03-15 NOTE — Discharge Instructions (Signed)

## 2021-03-15 NOTE — Lactation Note (Signed)
This note was copied from a baby's chart. Lactation Consultation Note  Patient Name: Sara [name] TIRWE'R Date: 03/15/2021 Reason for consult: Follow-up assessment;Early term 37-38.6wks Age:33 hours  Lactation check-in. Baby just completed all 24hr testing, parents and baby attempting to rest at this time. Mom briefly spoke with LC: baby cluster fed overnight, mom not feeling uncomfortable or sore, and willing to put baby to breast with all cues. Mom has used hand pump a couple of times for colostrum removal.   Praised mom for dedication of giving baby her breastmilk, and to putting baby to the breast. Encouraged to rest as the baby rests, and to feed with early cues. Reviewed cluster feeding, milk supply and demand and normal course of lactation. Output expectations for DOL 2: 2 wet/2 stools over next 24 hours.  Encouraged mom to call w/ questions, concerns or for Bf'ing assistance.  Maternal Data Has patient been taught Hand Expression?: Yes Does the patient have breastfeeding experience prior to this delivery?: Yes How long did the patient breastfeed?: 6-7 months  Feeding Mother's Current Feeding Choice: Breast Milk  LATCH Score Latch: Too sleepy or reluctant, no latch achieved, no sucking elicited.                  Lactation Tools Discussed/Used    Interventions Interventions: Education  Discharge Pump: Manual  Consult Status Consult Status: Follow-up Date: 03/15/21 Follow-up type: In-patient    Danford Bad 03/15/2021, 10:38 AM

## 2021-03-16 MED ORDER — IBUPROFEN 600 MG PO TABS: 600.0000 mg | ORAL_TABLET | Freq: Four times a day (QID) | ORAL | 0 refills | Status: DC

## 2021-03-16 NOTE — Lactation Note (Signed)
This note was copied from a baby's chart. Lactation Consultation Note  Patient Name: Sara [name] [id number]'[id number] Date: 03/16/2021 Reason for consult: Follow-up assessment;Early term 37-38.6wks Age:33 hours  Lactation follow-up prior to anticipated discharge. Mom and baby continue to breastfeed well. Mom has started to utilize her hand pump, receiving 10-22mL post feedings with infant.  Reviewed anticipated breast changes, management of breast fullness and engorgement, and nipple care.  Educated mom on milk supply and demand and normal course of lactation. Encouraged continued on demand feedings, feeding with early cues, and delaying artifical nipples if possible.  Information given for outpatient lactation services and community breastfeeding support given. Encouraged mom to call with questions/concerns and for ongoing BF support as needed.  Maternal Data Has patient been taught Hand Expression?: Yes Does the patient have breastfeeding experience prior to this delivery?: Yes How long did the patient breastfeed?: 6-7 months  Feeding Mother's Current Feeding Choice: Breast Milk  LATCH Score                    Lactation Tools Discussed/Used    Interventions    Discharge Pump: Personal;Manual  Consult Status Consult Status: Complete Date: 03/16/21 Follow-up type: In-patient    Danford Bad 03/16/2021, 9:29 AM

## 2021-03-16 NOTE — Progress Notes (Signed)
Patient discharged home with infant. Discharge instructions, prescriptions and follow up appointment given to and reviewed with patient. Patient verbalized understanding  

## 2021-03-21 ENCOUNTER — Encounter: Payer: Medicaid Other | Admitting: Advanced Practice Midwife

## 2021-04-03 ENCOUNTER — Inpatient Hospital Stay: Admit: 2021-04-03 | Payer: Self-pay

## 2021-04-07 ENCOUNTER — Telehealth: Payer: Medicaid Other | Admitting: Nurse Practitioner

## 2021-04-07 DIAGNOSIS — L03112 Cellulitis of left axilla: Secondary | ICD-10-CM

## 2021-04-07 MED ORDER — CEPHALEXIN 500 MG PO CAPS: 500.0000 mg | ORAL_CAPSULE | Freq: Three times a day (TID) | ORAL | 0 refills | Status: DC

## 2021-04-07 NOTE — Progress Notes (Signed)
E Visit for Cellulitis  We are sorry that you are not feeling well. Here is how we plan to help!  Based on what you shared with me it looks like you have cellulitis.  Cellulitis looks like areas of skin redness, swelling, and warmth; it develops as a result of bacteria entering under the skin. Little red spots and/or bleeding can be seen in skin, and tiny surface sacs containing fluid can occur. Fever can be present. Cellulitis is almost always on one side of a body, and the lower limbs are the most common site of involvement.   I have prescribed:  Keflex 500mg  take one by mouth fthree times a day for 10days  HOME CARE:  Take your medications as ordered and take all of them, even if the skin irritation appears to be healing.   GET HELP RIGHT AWAY IF:  Symptoms that don't begin to go away within 48 hours. Severe redness persists or worsens If the area turns color, spreads or swells. If it blisters and opens, develops yellow-brown crust or bleeds. You develop a fever or chills. If the pain increases or becomes unbearable.  Are unable to keep fluids and food down.  MAKE SURE YOU   Understand these instructions. Will watch your condition. Will get help right away if you are not doing well or get worse.  Thank you for choosing an e-visit.  Your e-visit answers were reviewed by a board certified advanced clinical practitioner to complete your personal care plan. Depending upon the condition, your plan could have included both over the counter or prescription medications.  Please review your pharmacy choice. Make sure the pharmacy is open so you can pick up prescription now. If there is a problem, you may contact your provider through and have the prescription routed to another pharmacy.  Your safety is important to [employer]. If you have drug allergies check your prescription carefully.   For the next 24 hours you can use MyChart to ask questions about today's visit, request a  non-urgent call back, or ask for a work or school excuse. You will get an email in the next two days asking about your experience. I hope that your e-visit has been valuable and will speed your recovery.  5-10 minutes spent reviewing and documenting in chart.

## 2021-04-27 ENCOUNTER — Other Ambulatory Visit: Payer: Self-pay

## 2021-04-27 ENCOUNTER — Ambulatory Visit (INDEPENDENT_AMBULATORY_CARE_PROVIDER_SITE_OTHER): Payer: Medicaid Other | Admitting: Advanced Practice Midwife

## 2021-04-27 ENCOUNTER — Encounter: Payer: Self-pay | Admitting: Advanced Practice Midwife

## 2021-04-27 DIAGNOSIS — Z30011 Encounter for initial prescription of contraceptive pills: Secondary | ICD-10-CM

## 2021-04-27 MED ORDER — NORETHINDRONE 0.35 MG PO TABS
1.0000 | ORAL_TABLET | Freq: Every day | ORAL | 11 refills | Status: DC
Start: 2021-04-27 — End: 2022-01-01

## 2021-04-27 NOTE — Progress Notes (Signed)
Postpartum Visit  Chief Complaint:  Chief Complaint  Patient presents with   Postpartum Care   Contraception    Interested in pills    History of Present Illness: Patient is a 35 y.o. [name] presents for postpartum visit.   Review the Delivery Report for details.   Date of delivery: 03/14/2021 Type of delivery: Vaginal delivery - Vacuum or forceps assisted  no Episiotomy No.  Laceration: no  Pregnancy or labor problems:  no Any problems since the delivery:  no  Newborn Details:  SINGLETON :  1. BabyGender female. Birth weight: 5 pounds 10 ounces Maternal Details:  Breast or formula feeding: breastfeeding Intercourse: Yes  Contraception after delivery:  POPs Any bowel or bladder issues: No  Post partum depression/anxiety noted:  no Edinburgh Post-Partum Depression Score: 2 Date of last PAP: 08/15/2020  NIL and HR HPV+    Review of Systems: Review of Systems  Constitutional:  Negative for chills and fever.  HENT:  Negative for congestion, ear discharge, ear pain, hearing loss, sinus pain and sore throat.   Eyes:  Negative for blurred vision and double vision.  Respiratory:  Negative for cough, shortness of breath and wheezing.   Cardiovascular:  Negative for chest pain, palpitations and leg swelling.  Gastrointestinal:  Negative for abdominal pain, blood in stool, constipation, diarrhea, heartburn, melena, nausea and vomiting.  Genitourinary:  Negative for dysuria, flank pain, frequency, hematuria and urgency.  Musculoskeletal:  Negative for back pain, joint pain and myalgias.  Skin:  Negative for itching and rash.  Neurological:  Negative for dizziness, tingling, tremors, sensory change, speech change, focal weakness, seizures, loss of consciousness, weakness and headaches.  Endo/Heme/Allergies:  Negative for environmental allergies. Does not bruise/bleed easily.  Psychiatric/Behavioral:  Negative for depression, hallucinations, memory loss, substance abuse and  suicidal ideas. The patient is not nervous/anxious and does not have insomnia.     Past Medical History:  Past Medical History:  Diagnosis Date   Abnormal Pap smear of cervix    GERD (gastroesophageal reflux disease)    Kidney stone    Seizures (HCC)     Past Surgical History:  Past Surgical History:  Procedure Laterality Date   CHOLECYSTECTOMY     lipoma removal  2013   TONSILLECTOMY  2009   TUBAL LIGATION  2014   TUBAL REVERSAL  01/2020    [facility]    Family History:  Family History  Problem Relation Age of Onset   Diabetes Maternal Grandmother    Stroke Maternal Great-grandmother    Thyroid disease Maternal Aunt     Social History:  Social History   Socioeconomic History   Marital status: Single    Spouse name: Not on file   Number of children: Not on file   Years of education: Not on file   Highest education level: Not on file  Occupational History   Not on file  Tobacco Use   Smoking status: Former    Types: Cigarettes    Quit date: 07/25/2020    Years since quitting: 0.7   Smokeless tobacco: Never  Vaping Use   Vaping Use: Never used  Substance and Sexual Activity   Alcohol use: Not Currently   Drug use: No   Sexual activity: Yes    Birth control/protection: None    Comment: Tubal Ligation/Reversal  Other Topics Concern   Not on file  Social History Narrative   Not on file   Social Determinants of Health   Financial Resource Strain:  Not on file  Food Insecurity: Not on file  Transportation Needs: Not on file  Physical Activity: Not on file  Stress: Not on file  Social Connections: Not on file  Intimate Partner Violence: Not on file    Allergies:  Allergies  Allergen Reactions   Phenergan [Promethazine Hcl] Nausea And Vomiting    Medications: Prior to Admission medications   Medication Sig Start Date End Date Taking? Authorizing Provider  norethindrone (MICRONOR) 0.35 MG tablet Take 1 tablet (0.35 mg total) by mouth daily. 04/27/21  Yes  Tresea Mall, CNM    Physical Exam Blood pressure 100/64, height 5\' 1"  (1.549 m), weight 169 lb (76.7 kg), currently breastfeeding.    General: NAD HEENT: normocephalic, anicteric Pulmonary: No increased work of breathing Abdomen: NABS, soft, non-tender, non-distended.  Umbilicus without lesions.  No hepatomegaly, splenomegaly or masses palpable. No evidence of hernia. Genitourinary:  External: Normal external female genitalia.  Normal urethral meatus, normal  Bartholin's and Skene's glands.    Vagina: Normal vaginal mucosa, no evidence of prolapse.    Cervix: Grossly normal in appearance, no bleeding, no CMT  Uterus: Non-enlarged, mobile, normal contour.    Adnexa: ovaries non-enlarged, no adnexal masses  Rectal: deferred Extremities: no edema, erythema, or tenderness Neurologic: Grossly intact Psychiatric: mood appropriate, affect full   Edinburgh Postnatal Depression Scale - 04/27/21 1430       Edinburgh Postnatal Depression Scale:  In the Past 7 Days   I have been able to laugh and see the funny side of things. 0    I have looked forward with enjoyment to things. 0    I have blamed myself unnecessarily when things went wrong. 0    I have been anxious or worried for no good reason. 1    I have felt scared or panicky for no good reason. 0    Things have been getting on top of me. 0    I have been so unhappy that I have had difficulty sleeping. 0    I have felt sad or miserable. 0    I have been so unhappy that I have been crying. 1    The thought of harming myself has occurred to me. 0    Edinburgh Postnatal Depression Scale Total 2             Assessment: 35 y.o. 34 presenting for 6 week postpartum visit  Plan: Problem List Items Addressed This Visit   None Visit Diagnoses     6 weeks postpartum follow-up    -  Primary   Relevant Medications   norethindrone (MICRONOR) 0.35 MG tablet   Encounter for initial prescription of contraceptive pills        Relevant Medications   norethindrone (MICRONOR) 0.35 MG tablet        1) Contraception - Education given regarding options for contraception, as well as compatibility with breast feeding if applicable.  Patient plans on oral progesterone-only contraceptive for contraception.  2)  Pap - ASCCP guidelines and rationale discussed.  Patient opts for every 3 years screening interval  3) Patient underwent screening for postpartum depression with no signs of depression  4) Return in about 1 year (around 04/27/2022) for annual established gyn.   06/27/2022, CNM [facility] 04/27/2021, 2:45 PM

## 2021-07-20 ENCOUNTER — Telehealth: Payer: Medicaid Other | Admitting: Emergency Medicine

## 2021-07-20 DIAGNOSIS — B3731 Acute candidiasis of vulva and vagina: Secondary | ICD-10-CM

## 2021-07-20 MED ORDER — FLUCONAZOLE 150 MG PO TABS: 150.0000 mg | ORAL_TABLET | Freq: Once | ORAL | 0 refills | Status: AC

## 2021-07-20 NOTE — Progress Notes (Signed)

## 2021-10-07 ENCOUNTER — Telehealth: Payer: Medicaid Other | Admitting: Nurse Practitioner

## 2021-10-07 DIAGNOSIS — B379 Candidiasis, unspecified: Secondary | ICD-10-CM

## 2021-10-07 MED ORDER — FLUCONAZOLE 150 MG PO TABS: 150.0000 mg | ORAL_TABLET | Freq: Once | ORAL | 0 refills | Status: AC

## 2021-10-07 NOTE — Progress Notes (Signed)
E-Visit for Vaginal Symptoms  We are sorry that you are not feeling well. Here is how we plan to help! Based on what you shared with me it looks like you: May have a yeast vaginosis  Vaginosis is an inflammation of the vagina that can result in discharge, itching and pain. The cause is usually a change in the normal balance of vaginal bacteria or an infection. Vaginosis can also result from reduced estrogen levels after menopause.  The most common causes of vaginosis are:   Bacterial vaginosis which results from an overgrowth of one on several organisms that are normally present in your vagina.   Yeast infections which are caused by a naturally occurring fungus called candida.   Vaginal atrophy (atrophic vaginosis) which results from the thinning of the vagina from reduced estrogen levels after menopause.   Trichomoniasis which is caused by a parasite and is commonly transmitted by sexual intercourse.  Factors that increase your risk of developing vaginosis include: Medications, such as antibiotics and steroids Uncontrolled diabetes Use of hygiene products such as bubble bath, vaginal spray or vaginal deodorant Douching Wearing damp or tight-fitting clothing Using an intrauterine device (IUD) for birth control Hormonal changes, such as those associated with pregnancy, birth control pills or menopause Sexual activity Having a sexually transmitted infection  Your treatment plan is A single Diflucan (fluconazole) 150mg tablet once.  I have electronically sent this prescription into the pharmacy that you have chosen.  Be sure to take all of the medication as directed. Stop taking any medication if you develop a rash, tongue swelling or shortness of breath. Mothers who are breast feeding should consider pumping and discarding their breast milk while on these antibiotics. However, there is no consensus that infant exposure at these doses would be harmful.  Remember that medication creams can  weaken latex condoms. .   HOME CARE:  Good hygiene may prevent some types of vaginosis from recurring and may relieve some symptoms:  Avoid baths, hot tubs and whirlpool spas. Rinse soap from your outer genital area after a shower, and dry the area well to prevent irritation. Don't use scented or harsh soaps, such as those with deodorant or antibacterial action. Avoid irritants. These include scented tampons and pads. Wipe from front to back after using the toilet. Doing so avoids spreading fecal bacteria to your vagina.  Other things that may help prevent vaginosis include:  Don't douche. Your vagina doesn't require cleansing other than normal bathing. Repetitive douching disrupts the normal organisms that reside in the vagina and can actually increase your risk of vaginal infection. Douching won't clear up a vaginal infection. Use a latex condom. Both female and female latex condoms may help you avoid infections spread by sexual contact. Wear cotton underwear. Also wear pantyhose with a cotton crotch. If you feel comfortable without it, skip wearing underwear to bed. Yeast thrives in moist environments Your symptoms should improve in the next day or two.  GET HELP RIGHT AWAY IF:  You have pain in your lower abdomen ( pelvic area or over your ovaries) You develop nausea or vomiting You develop a fever Your discharge changes or worsens You have persistent pain with intercourse You develop shortness of breath, a rapid pulse, or you faint.  These symptoms could be signs of problems or infections that need to be evaluated by a medical provider now.  MAKE SURE YOU   Understand these instructions. Will watch your condition. Will get help right away if you are not   doing well or get worse.  Thank you for choosing an e-visit.  Your e-visit answers were reviewed by a board certified advanced clinical practitioner to complete your personal care plan. Depending upon the condition, your plan  could have included both over the counter or prescription medications.  Please review your pharmacy choice. Make sure the pharmacy is open so you can pick up prescription now. If there is a problem, you may contact your provider through MyChart messaging and have the prescription routed to another pharmacy.  Your safety is important to us. If you have drug allergies check your prescription carefully.   For the next 24 hours you can use MyChart to ask questions about today's visit, request a non-urgent call back, or ask for a work or school excuse. You will get an email in the next two days asking about your experience. I hope that your e-visit has been valuable and will speed your recovery.   Meds ordered this encounter  Medications   fluconazole (DIFLUCAN) 150 MG tablet    Sig: Take 1 tablet (150 mg total) by mouth once for 1 dose.    Dispense:  1 tablet    Refill:  0     I spent approximately 5 minutes reviewing the patient's history, current symptoms and coordinating their plan of care today.   

## 2021-10-30 ENCOUNTER — Telehealth: Payer: Medicaid Other | Admitting: Physician Assistant

## 2021-10-30 ENCOUNTER — Encounter: Payer: Medicaid Other | Admitting: Physician Assistant

## 2021-10-30 ENCOUNTER — Other Ambulatory Visit: Payer: Self-pay | Admitting: Physician Assistant

## 2021-10-30 DIAGNOSIS — L700 Acne vulgaris: Secondary | ICD-10-CM

## 2021-10-30 MED ORDER — TRETINOIN 0.1 % EX CREA: TOPICAL_CREAM | Freq: Every day | CUTANEOUS | 0 refills | Status: DC

## 2021-10-30 MED ORDER — DOXYCYCLINE HYCLATE 100 MG PO TABS: 100.0000 mg | ORAL_TABLET | Freq: Two times a day (BID) | ORAL | 0 refills | Status: DC

## 2021-10-30 MED ORDER — CLINDAMYCIN PHOS-BENZOYL PEROX 1-5 % EX GEL: Freq: Two times a day (BID) | CUTANEOUS | 0 refills | Status: DC

## 2021-10-30 NOTE — Progress Notes (Signed)
We are sorry that you are experiencing this issue.  Here is how we plan to help! ? ?Based on what you shared with me it looks like you have cystic acne.  Acne is a disorder of the hair follicles and oil glands (sebaceous glands). The sebaceous glands secrete oils to keep the skin moist.  When the glands get clogged, it can lead to pimples or cysts.  These cysts may become infected and leave scars. Acne is very common and normally occurs at puberty.  Acne is also inherited. ? ?Your personal care plan consists of the following recommendations: ? ?I recommend that you use a daily cleanser ? ?You may try a topical exfoliator and salicylic acid scrub.  These scrubs have coarse particles that clear your pores but may also irritate your skin. ? ?I have prescribed a topical gel with an antibiotic: ? ?Clindamycin-benzoyl peroxide gel.  This gel should be applied to the affected areas twice a day.  Be sure to read the package insert to understand potential side effects. ? ?I have also prescribed one of the following additional therapies: ? ?Doxycycline an oral antibiotic 100 mg twice a day ? ?If excessive dryness or peeling occurs, reduce dose frequency or concentration of the topical scrubs.  If excessive stinging or burning occurs, remove the topical gel with mild soap and water and resume at a lower dose the next day.  Remember oral antibiotics and topical acne treatments may increase your sensitivity to the sun! ? ?HOME CARE: ?Do not squeeze pimples because that can often lead to infections, worse acne, and scars. ?Use a moisturizer that contains retinoid or fruit acids that may inhibit the development of new acne lesions. ?Although there is not a clear link that foods can cause acne, doctors do believe that too many sweets predispose you to skin problems. ? ?GET HELP RIGHT AWAY IF: ?If your acne gets worse or is not better within 10 days. ?If you become depressed. ?If you become pregnant, discontinue medications and call  your OB/GYN. ? ?MAKE SURE YOU: ?Understand these instructions. ?Will watch your condition. ?Will get help right away if you are not doing well or get worse. ? ?Thank you for choosing an e-visit. ? ?Your e-visit answers were reviewed by a board certified advanced clinical practitioner to complete your personal care plan. Depending upon the condition, your plan could have included both over the counter or prescription medications. ? ?Please review your pharmacy choice. Make sure the pharmacy is open so you can pick up prescription now. If there is a problem, you may contact your provider through [employer] and have the prescription routed to another pharmacy.  Your safety is important to [place]. If you have drug allergies check your prescription carefully.  ? ?For the next 24 hours you can use MyChart to ask questions about today's visit, request a non-urgent call back, or ask for a work or school excuse. ?You will get an email in the next two days asking about your experience. I hope that your e-visit has been valuable and will speed your recovery. ? ? ?I provided 5 minutes of non face-to-face time during this encounter for chart review and documentation.  ? ?

## 2021-10-30 NOTE — Progress Notes (Signed)
Duplicate

## 2021-10-30 NOTE — Addendum Note (Signed)
Addended by: Margaretann Loveless on: 10/30/2021 02:41 PM ? ? Modules accepted: Orders ? ?

## 2021-11-09 ENCOUNTER — Telehealth: Payer: Medicaid Other | Admitting: Physician Assistant

## 2021-11-09 DIAGNOSIS — R21 Rash and other nonspecific skin eruption: Secondary | ICD-10-CM

## 2021-11-10 NOTE — Progress Notes (Signed)
Based on what you shared with me, I feel your condition warrants further evaluation and I recommend that you be seen in a face to face visit. ? ?You have been appropriately treated for your symptoms and I am concerned that you have not had improvement despite this therapy. I would recommend that you follow up with a dermatologist, urgent care, or your primary care provider for your symptoms. ?  ?NOTE: There will be NO CHARGE for this eVisit ?  ?If you are having a true medical emergency please call 911.   ?  ? For an urgent face to face visit, [facility] has six urgent care centers for your convenience:  ?  ? [facility] [place] [facility] ?Get Driving Directions ?[phone] ?[address] ?[place], [place] [address] ?  ? [facility]) ?Get Driving Directions ?[phone] ?[address] ?[place], [place] [address] ? ?[facility] [place] [facility]) ?Get Driving Directions ?[place],  [place]  [address] ? ?[facility] ?Get Driving Directions ?[phone] ?[address] ?[place], [place] [address] ?  ?[facility] [place] ?Get Driving Directions  ?[phone] ?[address] ?[address] ?[place], [place] [address] ?  ?[facility] ?Get Driving Directions ?[phone] ?[address] ?[place], [place] [address] ? ?Your MyChart E-visit questionnaire answers were reviewed by a board certified advanced clinical practitioner to complete your personal care plan based on your specific symptoms.  Thank you for using e-Visits. ?  ?Approximately 5 minutes was spent documenting and reviewing patient's chart. ? ?

## 2022-01-01 ENCOUNTER — Other Ambulatory Visit: Payer: Self-pay

## 2022-01-01 ENCOUNTER — Encounter: Payer: Self-pay | Admitting: Advanced Practice Midwife

## 2022-01-01 ENCOUNTER — Telehealth: Payer: Medicaid Other | Admitting: Emergency Medicine

## 2022-01-01 DIAGNOSIS — Z30011 Encounter for initial prescription of contraceptive pills: Secondary | ICD-10-CM

## 2022-01-01 DIAGNOSIS — K649 Unspecified hemorrhoids: Secondary | ICD-10-CM

## 2022-01-01 MED ORDER — NORETHINDRONE 0.35 MG PO TABS: 1.0000 | ORAL_TABLET | Freq: Every day | ORAL | 4 refills | Status: DC

## 2022-01-01 MED ORDER — HYDROCORTISONE 2.5 % EX OINT: TOPICAL_OINTMENT | Freq: Two times a day (BID) | CUTANEOUS | 0 refills | Status: DC

## 2022-01-01 NOTE — Progress Notes (Signed)
E-Visit for Hemorrhoid  We are sorry that you are not feeling well. We are here to help!  Hemorrhoids are swollen veins in the rectum. They can cause itching, bleeding, and pain. Hemorrhoids are very common.  In some cases, you can see or feel hemorrhoids around the outside of the rectum. In other cases, you cannot see them because they are hidden inside the rectum. Be patient - It can take months for this to improve or go away.   Hemorrhoids do not always cause symptoms. But when they do, symptoms can include: ?Itching of the skin around the anus ?Bleeding - Bleeding is usually painless. You might see bright red blood after using the toilet. ?Pain - If a blood clot forms inside a hemorrhoid, this can cause pain. It can also cause a lump that you might be able to feel.   What can I do to keep from getting more hemorrhoids? -- The most important thing you can do is to keep from getting constipated. You should have a bowel movement at least a few times a week. When you have a bowel movement, you also should not have to push too much. Plus, your bowel movements should not be too hard. Being constipated and having hard bowel movements can make hemorrhoids worse.   I have prescribed Topical Hydrocortisone ointment 2.5%.  Apply to area two times per day for 30 days  HOME CARE: Sitz Baths twice daily. Soak buttocks in 2 or 3 inches of warm water for 10 to 15 minutes. Do not add soap, bubble bath, or anything to the water. Stool softener such as Colace 100 mg twice daily AND Miralax 1 scoop daily until you have regular soft stools Over the counter Preparation H Tucks Pads Witch Hazel  Here are some steps you can take to avoid getting constipated or having hard stools:  ?Eat lots of fruits, vegetables, and other foods with fiber. Fiber helps to increase bowel movements. If you do not get enough fiber from your diet, you can take fiber supplements. These come in the form of powders, wafers, or  pills. Some examples are Metamucil, Citrucel, Benefiber and FiberCon. If you take a fiber supplement, be sure to read the label so you know how much to take. If you're not sure, ask your provider or nurse. ?Take medicines called "stool softeners" such as docusate sodium (sample brand names: Colace, Dulcolax). These medicines increase the number of bowel movements you have. They are safe to take and they can prevent problems later.  You should request a referral for a follow up evaluation with a Gastroenterologist (GI doctor) to evaluate this chronic and relapsing condition - even if it improves to see what further steps need to be taken. This is highly linked to chronic constipation and straining to have a bowel movement. It may require further treatment or surgical intervention.   GET HELP RIGHT AWAY IF: You develop severe pain You have heavy bleeding   FOLLOW UP WITH YOUR PRIMARY PROVIDER IF: If your symptoms do not improve within 10 days  MAKE SURE YOU  Understand these instructions. Will watch your condition. Will get help right away if you are not doing well or get worse.   Thank you for choosing an e-visit.  Your e-visit answers were reviewed by a board certified advanced clinical practitioner to complete your personal care plan. Depending upon the condition, your plan could have included both over the counter or prescription medications.  Please review your pharmacy choice. Make   sure the pharmacy is open so you can pick up prescription now. If there is a problem, you may contact your provider through MyChart messaging and have the prescription routed to another pharmacy.  Your safety is important to us. If you have drug allergies check your prescription carefully.   For the next 24 hours you can use MyChart to ask questions about today's visit, request a non-urgent call back, or ask for a work or school excuse. You will get an email in the next two days asking about your experience.  I hope that your e-visit has been valuable and will speed your recovery.   I have spent 5 minutes in review of e-visit questionnaire, review and updating patient chart, medical decision making and response to patient.   Margie Urbanowicz, PhD, FNP-BC   

## 2022-01-16 ENCOUNTER — Telehealth: Payer: Self-pay

## 2022-01-16 ENCOUNTER — Other Ambulatory Visit: Payer: Medicaid Other

## 2022-01-16 DIAGNOSIS — Z331 Pregnant state, incidental: Secondary | ICD-10-CM

## 2022-01-16 NOTE — Telephone Encounter (Signed)
Pt calling; front desk lady sent her to triage line; she has had several positive pregnancy tests; has been on bcp for 67m; doesn't know what to do.  [phone] Adv pt to have a blood preg test done; tx'd to [facility] for scheduling.

## 2022-01-17 ENCOUNTER — Encounter: Payer: Self-pay | Admitting: Advanced Practice Midwife

## 2022-01-17 LAB — HCG, SERUM, QUALITATIVE: hCG,Beta Subunit,Qual,Serum: POSITIVE m[IU]/mL — AB (ref <6)

## 2022-01-17 NOTE — Telephone Encounter (Signed)
Please advise 

## 2022-02-23 ENCOUNTER — Ambulatory Visit (INDEPENDENT_AMBULATORY_CARE_PROVIDER_SITE_OTHER): Payer: Medicaid Other

## 2022-02-23 VITALS — Wt 165.0 lb

## 2022-02-23 DIAGNOSIS — Z3480 Encounter for supervision of other normal pregnancy, unspecified trimester: Secondary | ICD-10-CM

## 2022-02-23 DIAGNOSIS — O0940 Supervision of pregnancy with grand multiparity, unspecified trimester: Secondary | ICD-10-CM

## 2022-02-23 DIAGNOSIS — Z3A00 Weeks of gestation of pregnancy not specified: Secondary | ICD-10-CM

## 2022-02-23 DIAGNOSIS — O09529 Supervision of elderly multigravida, unspecified trimester: Secondary | ICD-10-CM

## 2022-02-23 NOTE — Progress Notes (Signed)
New OB Intake  I connected with  Sara [name] on 02/23/22 at  8:15 AM EDT by telephone Video Visit and verified that I am speaking with the correct person using two identifiers. Nurse is located at [facility] and pt is located at home.  I explained I am completing New OB Intake today. We discussed her EDD of 08/20/2022 that is based on LMP of 11/13/2021. Pt is G5/P4004. I reviewed her allergies, medications, Medical/Surgical/OB history, and appropriate screenings. Based on history, this is a/an pregnancy uncomplicated .   Patient Active Problem List   Diagnosis Date Noted   Labor and delivery, indication for care 03/14/2021   Normal delivery 03/14/2021   Postpartum care following vaginal delivery 03/14/2021   Encounter for care or examination of lactating mother 03/14/2021   [redacted] weeks gestation of pregnancy 03/14/2021   Leakage of amniotic fluid 03/08/2021   Supervision of other normal pregnancy, antepartum 08/15/2020    Concerns addressed today None.  Delivery Plans:  Plans to deliver at [facility]  Anatomy US Explained first scheduled [place] will be around 20 weeks.  Labs Discussed Avelina Laine genetic screening with patient. Patient desires genetic testing to be drawn with new OB Labs. Discussed possible labs to be drawn at new OB appointment.  COVID Vaccine Patient has not had COVID vaccine.   Social Determinants of Health Food Insecurity: denies food insecurity Transportation: Patient denies transportation needs.   First visit review I reviewed new OB appt with pt. I explained she will have ob bloodwork and pap smear/pelvic exam if indicated. Explained pt will be seen by Vance Peper, CNM at first visit; encounter routed to appropriate provider.   Loran Senters, [facility] 02/23/2022  8:34 AM  Clinical Staff Provider  Office Location  [facility] Dating    Language  English Anatomy US    Flu Vaccine  offer Genetic Screen  NIPS:   TDaP vaccine   offer Hgb A1C  or  GTT Early : Third trimester :   Covid declines   LAB RESULTS   Rhogam   Blood Type --/--/O POS (07/19 3154)   Feeding Plan breast Antibody NEG (07/19 0086)  Contraception undecided Rubella    Circumcision yes RPR NON REACTIVE (07/19 0634)   Pediatrician  [facility]     Support Person [place] HIV    Prenatal Classes no Varicella     GBS Positive/-- (07/11 0845)(For PCN allergy, check sensitivities)   BTL Consent  Hep C   VBAC Consent  Pap      Hgb Electro      CF      SMA

## 2022-03-02 ENCOUNTER — Other Ambulatory Visit: Payer: Medicaid Other

## 2022-03-02 DIAGNOSIS — Z3480 Encounter for supervision of other normal pregnancy, unspecified trimester: Secondary | ICD-10-CM

## 2022-03-03 LAB — CBC/D/PLT+RPR+RH+ABO+RUBIGG...
Antibody Screen: NEGATIVE
Basophils Absolute: 0.1 10*3/uL (ref 0.0–0.2)
Basos: 1 %
EOS (ABSOLUTE): 0.2 10*3/uL (ref 0.0–0.4)
Eos: 2 %
HCV Ab: NONREACTIVE
HIV Screen 4th Generation wRfx: NONREACTIVE
Hematocrit: 40.5 % (ref 34.0–46.6)
Hemoglobin: 14 g/dL (ref 11.1–15.9)
Hepatitis B Surface Ag: NEGATIVE
Immature Grans (Abs): 0.1 10*3/uL (ref 0.0–0.1)
Immature Granulocytes: 1 %
Lymphocytes Absolute: 1.6 10*3/uL (ref 0.7–3.1)
Lymphs: 19 %
MCH: 32.3 pg (ref 26.6–33.0)
MCHC: 34.6 g/dL (ref 31.5–35.7)
MCV: 93 fL (ref 79–97)
Monocytes Absolute: 0.4 10*3/uL (ref 0.1–0.9)
Monocytes: 5 %
Neutrophils Absolute: 6 10*3/uL (ref 1.4–7.0)
Neutrophils: 72 %
Platelets: 291 10*3/uL (ref 150–450)
RBC: 4.34 x10E6/uL (ref 3.77–5.28)
RDW: 12.8 % (ref 11.7–15.4)
RPR Ser Ql: NONREACTIVE
Rh Factor: POSITIVE
Rubella Antibodies, IGG: 2.02 index (ref >0.99)
Varicella zoster IgG: 613 index (ref >165)
WBC: 8.4 10*3/uL (ref 3.4–10.8)

## 2022-03-03 LAB — HCV INTERPRETATION

## 2022-03-09 ENCOUNTER — Other Ambulatory Visit (HOSPITAL_COMMUNITY)
Admission: RE | Admit: 2022-03-09 | Discharge: 2022-03-09 | Disposition: A | Discharge status: Home / Self Care | Payer: Medicaid Other | Source: Ambulatory Visit | Attending: Licensed Practical Nurse | Admitting: Licensed Practical Nurse

## 2022-03-09 ENCOUNTER — Ambulatory Visit (INDEPENDENT_AMBULATORY_CARE_PROVIDER_SITE_OTHER): Payer: Medicaid Other | Admitting: Licensed Practical Nurse

## 2022-03-09 ENCOUNTER — Encounter: Payer: Self-pay | Admitting: Licensed Practical Nurse

## 2022-03-09 VITALS — BP 120/80 | Wt 167.0 lb

## 2022-03-09 DIAGNOSIS — Z3480 Encounter for supervision of other normal pregnancy, unspecified trimester: Secondary | ICD-10-CM | POA: Diagnosis present

## 2022-03-09 DIAGNOSIS — Z0283 Encounter for blood-alcohol and blood-drug test: Secondary | ICD-10-CM

## 2022-03-09 DIAGNOSIS — Z113 Encounter for screening for infections with a predominantly sexual mode of transmission: Secondary | ICD-10-CM

## 2022-03-09 DIAGNOSIS — Z3A16 16 weeks gestation of pregnancy: Secondary | ICD-10-CM

## 2022-03-10 ENCOUNTER — Other Ambulatory Visit: Payer: Self-pay | Admitting: Licensed Practical Nurse

## 2022-03-10 NOTE — Progress Notes (Unsigned)
[id number]

## 2022-03-11 LAB — URINE DRUG PANEL 7
Amphetamines, Urine: NEGATIVE ng/mL
Barbiturate Quant, Ur: NEGATIVE ng/mL
Benzodiazepine Quant, Ur: NEGATIVE ng/mL
Cannabinoid Quant, Ur: NEGATIVE ng/mL
Cocaine (Metab.): NEGATIVE ng/mL
Opiate Quant, Ur: NEGATIVE ng/mL
PCP Quant, Ur: NEGATIVE ng/mL

## 2022-03-12 ENCOUNTER — Other Ambulatory Visit: Payer: Self-pay | Admitting: Licensed Practical Nurse

## 2022-03-12 DIAGNOSIS — Z3480 Encounter for supervision of other normal pregnancy, unspecified trimester: Secondary | ICD-10-CM

## 2022-03-12 NOTE — Progress Notes (Signed)
Order placed for dating

## 2022-03-12 NOTE — Progress Notes (Signed)
Routine Prenatal Care Visit  Subjective  Sara [name] is a 36 y.o. G5P4004 at [redacted]w[redacted]d being seen today for ongoing prenatal care.  She is currently monitored for the following issues for this low-risk pregnancy and has Supervision of other normal pregnancy, antepartum; Leakage of amniotic fluid; Labor and delivery, indication for care; Normal delivery; Postpartum care following vaginal delivery; Encounter for care or examination of lactating mother; and [redacted] weeks gestation of pregnancy on their problem list.  ----------------------------------------------------------------------------------- Patient reports fatigue, nausea, and breast tenderness .   -Had NOB intake, expresses concern for due date, Reports we are using an EDD based off of a LMP of March, but she did have a 4 day cycle in April. Desires dating [place].  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  Attempted to explain that the recommendation is to repeat the pap with HPV positive, pt states she does not need a pap.  -Had a seizure after her second birth in 2009, has not had one since  -Breastfed her last 2 children for around 6 months, hopes to breastfeed for longer this time.   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   -hx depression/anxiety currently not being treated and feels "fine" did not have any problems PP    . Vag. Bleeding: None.  Movement: Absent. Leaking Fluid denies.  ----------------------------------------------------------------------------------- The following portions of the patient's history were reviewed and updated as appropriate: allergies,  current medications, past family history, past medical history, past social history, past surgical history and problem list. Problem list updated.  Objective  Blood pressure 120/80, weight 167 lb (75.8 kg), last menstrual period 11/13/2021, currently breastfeeding. Pregravid weight 155 lb (70.3 kg) Total Weight Gain 12 lb (5.443 kg) Urinalysis: Urine Protein    Urine Glucose    Fetal Status: Fetal Heart Rate (bpm): 150   Movement: Absent    Breasts: soft, no redness or masses General:  Alert, oriented and cooperative. Patient is in no acute distress.  Skin: Skin is warm and dry. No rash noted.   Cardiovascular: Normal heart rate noted  Respiratory: Normal respiratory effort, no problems with respiration noted  Abdomen: Soft, gravid, appropriate for gestational age. Pain/Pressure: Absent     Pelvic:  Cervical exam deferred      uterus about 12-14 wks size  Cervix pink, no lesions   Extremities: Normal range of motion.     Mental Status: Normal mood and affect. Normal behavior. Normal judgment and thought content.   Assessment   36 y.o. T4S5681 at [redacted]w[redacted]d by  09/26/2022 (based on pt's reported LMP toady), by Last Menstrual Period presenting for routine prenatal visit  Plan   fifth Problems (from 02/23/22 to present)     No problems associated with this episode.        general obstetric precautions including but not limited to vaginal bleeding, contractions, leaking of fluid and fetal movement were reviewed in detail with the patient. Please refer to After Visit Summary for other counseling recommendations.   Return in about 4 weeks (around 04/06/2022) for  ROB, early 1 hour glucose test.  Dating [place] ordered Desires genetic screening without gender once dating is confirmed.   Carie Caddy, CNM  Domingo Pulse, [facility]  03/12/22  11:10 AM

## 2022-03-13 LAB — CERVICOVAGINAL ANCILLARY ONLY
Chlamydia: NEGATIVE
Comment: NEGATIVE
Comment: NORMAL
Neisseria Gonorrhea: NEGATIVE

## 2022-03-16 ENCOUNTER — Other Ambulatory Visit: Payer: Medicaid Other

## 2022-03-16 LAB — CULTURE, URINE COMPREHENSIVE

## 2022-03-19 ENCOUNTER — Other Ambulatory Visit: Payer: Self-pay | Admitting: Licensed Practical Nurse

## 2022-03-19 ENCOUNTER — Other Ambulatory Visit: Payer: Medicaid Other

## 2022-03-19 DIAGNOSIS — Z131 Encounter for screening for diabetes mellitus: Secondary | ICD-10-CM

## 2022-03-19 DIAGNOSIS — Z3480 Encounter for supervision of other normal pregnancy, unspecified trimester: Secondary | ICD-10-CM

## 2022-03-19 NOTE — Progress Notes (Signed)
Order for early 1 hour placed  Carie Caddy, CNM  Domingo Pulse, [facility]  03/19/22  10:14 AM

## 2022-03-20 LAB — GLUCOSE, 1 HOUR GESTATIONAL: Gestational Diabetes Screen: 99 mg/dL (ref 70–139)

## 2022-03-22 ENCOUNTER — Ambulatory Visit: Payer: Medicaid Other

## 2022-03-22 DIAGNOSIS — Z3480 Encounter for supervision of other normal pregnancy, unspecified trimester: Secondary | ICD-10-CM

## 2022-03-24 LAB — MATERNIT 21 PLUS CORE, BLOOD
Fetal Fraction: 9
Result (T21): NEGATIVE
Trisomy 13 (Patau syndrome): NEGATIVE
Trisomy 18 (Edwards syndrome): NEGATIVE
Trisomy 21 (Down syndrome): NEGATIVE

## 2022-03-29 ENCOUNTER — Ambulatory Visit (INDEPENDENT_AMBULATORY_CARE_PROVIDER_SITE_OTHER): Payer: Medicaid Other

## 2022-03-29 ENCOUNTER — Other Ambulatory Visit: Payer: Self-pay | Admitting: Licensed Practical Nurse

## 2022-03-29 DIAGNOSIS — Z3A14 14 weeks gestation of pregnancy: Secondary | ICD-10-CM | POA: Diagnosis not present

## 2022-03-29 DIAGNOSIS — Z3482 Encounter for supervision of other normal pregnancy, second trimester: Secondary | ICD-10-CM | POA: Diagnosis not present

## 2022-04-06 ENCOUNTER — Encounter: Payer: Medicaid Other | Admitting: Obstetrics

## 2022-04-09 ENCOUNTER — Ambulatory Visit (INDEPENDENT_AMBULATORY_CARE_PROVIDER_SITE_OTHER): Payer: Medicaid Other | Admitting: Obstetrics & Gynecology

## 2022-04-09 VITALS — BP 100/60 | Wt 169.0 lb

## 2022-04-09 DIAGNOSIS — Z3A15 15 weeks gestation of pregnancy: Secondary | ICD-10-CM

## 2022-04-09 LAB — POCT URINALYSIS DIPSTICK OB
Glucose, UA: NEGATIVE
POC,PROTEIN,UA: NEGATIVE

## 2022-04-09 NOTE — Progress Notes (Signed)
ROB- no concerns 

## 2022-04-11 LAB — AFP, SERUM, OPEN SPINA BIFIDA
AFP MoM: 1.02
AFP Value: 28.6 ng/mL
Gest. Age on Collection Date: 15 weeks
Maternal Age At EDD: 34.4 yr
OSBR Risk 1 IN: 10000
Test Results:: NEGATIVE
Weight: 169 [lb_av]

## 2022-05-07 ENCOUNTER — Ambulatory Visit (INDEPENDENT_AMBULATORY_CARE_PROVIDER_SITE_OTHER): Payer: Medicaid Other | Admitting: Licensed Practical Nurse

## 2022-05-07 VITALS — BP 100/60 | Wt 178.0 lb

## 2022-05-07 DIAGNOSIS — Z3A19 19 weeks gestation of pregnancy: Secondary | ICD-10-CM

## 2022-05-07 DIAGNOSIS — Z3480 Encounter for supervision of other normal pregnancy, unspecified trimester: Secondary | ICD-10-CM

## 2022-05-07 NOTE — Progress Notes (Signed)
Routine Prenatal Care Visit  Subjective  Sara [name] is a 36 y.o. L2G4010 at [redacted]w[redacted]d being seen today for ongoing prenatal care.  She is currently monitored for the following issues for this low-risk pregnancy and has Supervision of other normal pregnancy, antepartum on their problem list.  ----------------------------------------------------------------------------------- Patient reports nausea and vomiting.  Managing with Unisom and B12. Mood  has been good.  Contractions: Not present. Vag. Bleeding: None.  Movement: Present. Leaking Fluid denies.  ----------------------------------------------------------------------------------- The following portions of the patient's history were reviewed and updated as appropriate: allergies, current medications, past family history, past medical history, past social history, past surgical history and problem list. Problem list updated.  Objective  Blood pressure 100/60, weight 178 lb (80.7 kg), last menstrual period 12/20/2021, currently breastfeeding. Pregravid weight 155 lb (70.3 kg) Total Weight Gain 23 lb (10.4 kg) Urinalysis: Urine Protein    Urine Glucose    Fetal Status: Fetal Heart Rate (bpm): 150   Movement: Present     General:  Alert, oriented and cooperative. Patient is in no acute distress.  Skin: Skin is warm and dry. No rash noted.   Cardiovascular: Normal heart rate noted  Respiratory: Normal respiratory effort, no problems with respiration noted  Abdomen: Soft, gravid, appropriate for gestational age. Pain/Pressure: Absent     Pelvic:  Cervical exam deferred        Extremities: Normal range of motion.     Mental Status: Normal mood and affect. Normal behavior. Normal judgment and thought content.   Assessment   36 y.o. [id number] at [redacted]w[redacted]d by  09/26/2022, by Last Menstrual Period presenting for routine prenatal visit  Plan   fifth Problems (from 02/23/22 to present)    Problem Noted Resolved   Supervision of other normal  pregnancy, antepartum 03/12/2022 by Ellwood Sayers, CNM No   Overview Signed 03/12/2022 11:14 AM by Ellwood Sayers, CNM      fifth Problems (from 02/23/22 to present)    Problem Noted Resolved   Supervision of other normal pregnancy, antepartum 03/12/2022 by Bessie Boyte, Courtney Heys, CNM No        Nursing Staff Provider  Office Location  [facility] Dating    Language  English  Anatomy US    Flu Vaccine   Genetic Screen  NIPS:   TDaP vaccine    Hgb A1C or  GTT Early : Third trimester :   Covid    LAB RESULTS   Rhogam  NA Blood Type O/Positive/-- (07/07 0935)   Feeding Plan Breast Antibody Negative (07/07 0935)  Contraception  Rubella 2.02 (07/07 0935)  Circumcision  RPR Non Reactive (07/07 0935)   Pediatrician   HBsAg Negative (07/07 0935)   Support Person  HIV Non Reactive (07/07 0935)  Prenatal Classes  Varicella Immune    GBS  (For PCN allergy, check sensitivities)   BTL Consent     VBAC Consent  Pap  12/21 NILM HPV pos     Hgb Electro    Pelvis Tested  CF      SMA                   Preterm labor symptoms and general obstetric precautions including but not limited to vaginal bleeding, contractions, leaking of fluid and fetal movement were reviewed in detail with the patient. Please refer to After Visit Summary for other counseling recommendations.   Return in about 4 weeks (around 06/04/2022) for ROB.   Anatomy US ordered  Carie Caddy, CNM  Domingo Pulse, [facility]  05/07/22  1:07 PM

## 2022-05-11 ENCOUNTER — Ambulatory Visit
Admission: RE | Admit: 2022-05-11 | Discharge: 2022-05-11 | Disposition: A | Discharge status: Home / Self Care | Payer: Medicaid Other | Source: Ambulatory Visit | Attending: Licensed Practical Nurse | Admitting: Licensed Practical Nurse

## 2022-05-11 DIAGNOSIS — Z3480 Encounter for supervision of other normal pregnancy, unspecified trimester: Secondary | ICD-10-CM | POA: Diagnosis present

## 2022-05-11 DIAGNOSIS — Z3689 Encounter for other specified antenatal screening: Secondary | ICD-10-CM | POA: Diagnosis not present

## 2022-05-11 DIAGNOSIS — Z3482 Encounter for supervision of other normal pregnancy, second trimester: Secondary | ICD-10-CM | POA: Insufficient documentation

## 2022-05-11 DIAGNOSIS — Z3A20 20 weeks gestation of pregnancy: Secondary | ICD-10-CM | POA: Insufficient documentation

## 2022-05-22 ENCOUNTER — Telehealth: Payer: Self-pay | Admitting: Obstetrics & Gynecology

## 2022-05-22 NOTE — Telephone Encounter (Signed)
Patient is scheduled with CJE on 06/04/22 at 9:35. Left message for patient to call office back to r/s

## 2022-05-22 NOTE — Telephone Encounter (Signed)
Patient is scheduled for 06/04/22 with AT

## 2022-05-28 ENCOUNTER — Encounter: Payer: Self-pay | Admitting: Obstetrics & Gynecology

## 2022-06-04 ENCOUNTER — Encounter: Payer: Medicaid Other | Admitting: Obstetrics & Gynecology

## 2022-06-04 ENCOUNTER — Encounter: Payer: Self-pay | Admitting: Certified Nurse Midwife

## 2022-06-04 ENCOUNTER — Ambulatory Visit (INDEPENDENT_AMBULATORY_CARE_PROVIDER_SITE_OTHER): Payer: Medicaid Other | Admitting: Certified Nurse Midwife

## 2022-06-04 VITALS — BP 103/68 | HR 103 | Wt 180.8 lb

## 2022-06-04 DIAGNOSIS — Z3482 Encounter for supervision of other normal pregnancy, second trimester: Secondary | ICD-10-CM

## 2022-06-04 DIAGNOSIS — Z3A23 23 weeks gestation of pregnancy: Secondary | ICD-10-CM

## 2022-06-04 LAB — POCT URINALYSIS DIPSTICK OB
Bilirubin, UA: NEGATIVE
Blood, UA: NEGATIVE
Glucose, UA: NEGATIVE
Ketones, UA: NEGATIVE
Leukocytes, UA: NEGATIVE
Nitrite, UA: NEGATIVE
POC,PROTEIN,UA: NEGATIVE
Spec Grav, UA: 1.02 (ref 1.010–1.025)
Urobilinogen, UA: 0.2 E.U./dL
pH, UA: 6 (ref 5.0–8.0)

## 2022-06-04 NOTE — Patient Instructions (Signed)
Oral Glucose Tolerance Test During Pregnancy Why am I having this test? The oral glucose tolerance test (OGTT) is done to check how your body processes blood sugar (glucose). This is one of several tests used to diagnose diabetes that develops during pregnancy (gestational diabetes mellitus). Gestational diabetes is a short-term form of diabetes that some women develop while they are pregnant. It usually occurs during the second trimester of pregnancy and goes away after delivery. Testing, or screening, for gestational diabetes usually occurs at weeks 24-28 of pregnancy. You may have the OGTT test after having a 1-hour glucose screening test if the results from that test indicate that you may have gestational diabetes. This test may also be needed if: You have a history of gestational diabetes. There is a history of giving birth to very large babies or of losing pregnancies (having stillbirths). You have signs and symptoms of diabetes, such as: Changes in your eyesight. Tingling or numbness in your hands or feet. Changes in hunger, thirst, and urination, and these are not explained by your pregnancy. What is being tested? This test measures the amount of glucose in your blood at different times during a period of 3 hours. This shows how well your body can process glucose. What kind of sample is taken?  Blood samples are required for this test. They are usually collected by inserting a needle into a blood vessel. How do I prepare for this test? For 3 days before your test, eat normally. Have plenty of carbohydrate-rich foods. Follow instructions from your health care provider about: Eating or drinking restrictions on the day of the test. You may be asked not to eat or drink anything other than water (to fast) starting 8-10 hours before the test. Changing or stopping your regular medicines. Some medicines may interfere with this test. Tell a health care provider about: All medicines you are  taking, including vitamins, herbs, eye drops, creams, and over-the-counter medicines. Any blood disorders you have. Any surgeries you have had. Any medical conditions you have. What happens during the test? First, your blood glucose will be measured. This is referred to as your fasting blood glucose because you fasted before the test. Then, you will drink a glucose solution that contains a certain amount of glucose. Your blood glucose will be measured again 1, 2, and 3 hours after you drink the solution. This test takes about 3 hours to complete. You will need to stay at the testing location during this time. During the testing period: Do not eat or drink anything other than the glucose solution. Do not exercise. Do not use any products that contain nicotine or tobacco, such as cigarettes, e-cigarettes, and chewing tobacco. These can affect your test results. If you need help quitting, ask your health care provider. The testing procedure may vary among health care providers and hospitals. How are the results reported? Your results will be reported as milligrams of glucose per deciliter of blood (mg/dL) or millimoles per liter (mmol/L). There is more than one source for screening and diagnosis reference values used to diagnose gestational diabetes. Your health care provider will compare your results to normal values that were established after testing a large group of people (reference values). Reference values may vary among labs and hospitals. For this test (Carpenter-Coustan), reference values are: Fasting: 95 mg/dL (5.3 mmol/L). 1 hour: 180 mg/dL (10.0 mmol/L). 2 hour: 155 mg/dL (8.6 mmol/L). 3 hour: 140 mg/dL (7.8 mmol/L). What do the results mean? Results below the reference values are   considered normal. If two or more of your blood glucose levels are at or above the reference values, you may be diagnosed with gestational diabetes. If only one level is high, your health care provider may  suggest repeat testing or other tests to confirm a diagnosis. Talk with your health care provider about what your results mean. Questions to ask your health care provider Ask your health care provider, or the department that is doing the test: When will my results be ready? How will I get my results? What are my treatment options? What other tests do I need? What are my next steps? Summary The oral glucose tolerance test (OGTT) is one of several tests used to diagnose diabetes that develops during pregnancy (gestational diabetes mellitus). Gestational diabetes is a short-term form of diabetes that some women develop while they are pregnant. You may have the OGTT test after having a 1-hour glucose screening test if the results from that test show that you may have gestational diabetes. You may also have this test if you have any symptoms or risk factors for this type of diabetes. Talk with your health care provider about what your results mean. This information is not intended to replace advice given to you by your health care provider. Make sure you discuss any questions you have with your health care provider. Document Revised: 01/21/2020 Document Reviewed: 01/21/2020 Elsevier Patient Education  2023 Elsevier Inc.  

## 2022-06-04 NOTE — Progress Notes (Signed)
  Patient states no questions or concerns at this time.

## 2022-06-04 NOTE — Progress Notes (Signed)
ROB doing well, feeling good movement. Denies any complains or questions today. Discussed glucose test next visit. She verbalizes and agrees. Follow up 4 wks .   Deneise Lever Koki Buxton< CNM

## 2022-06-12 IMAGING — US US OB COMP +14 WK
1 of 2 series · 13 of 28 positions shown · non-contrast
Comparison: none

CLINICAL DATA: Second trimester pregnancy for fetal anatomy survey.

EXAM:
OBSTETRICAL ULTRASOUND >14 WKS

[Series 1: us ob comp + 14 wk · 13 of 78 slices shown]
[im 3/78]
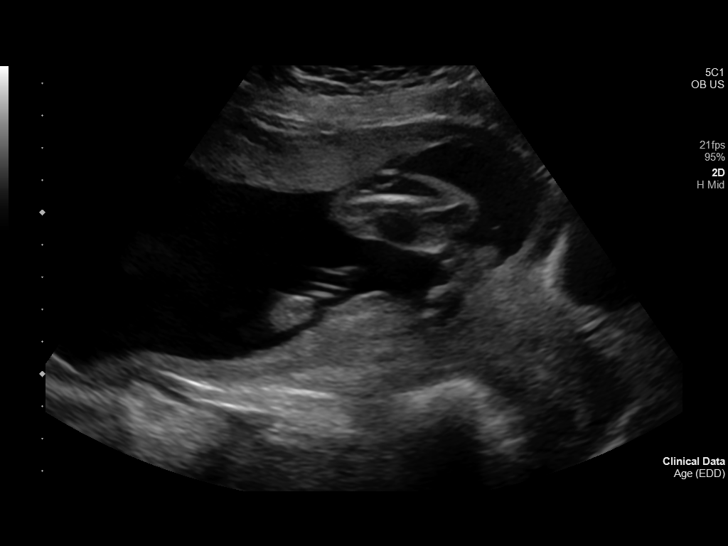
[im 9/78]
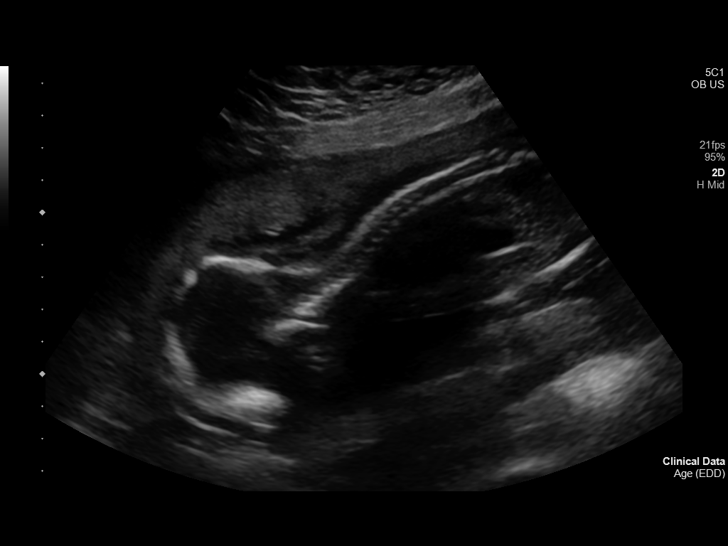
[im 15/78]
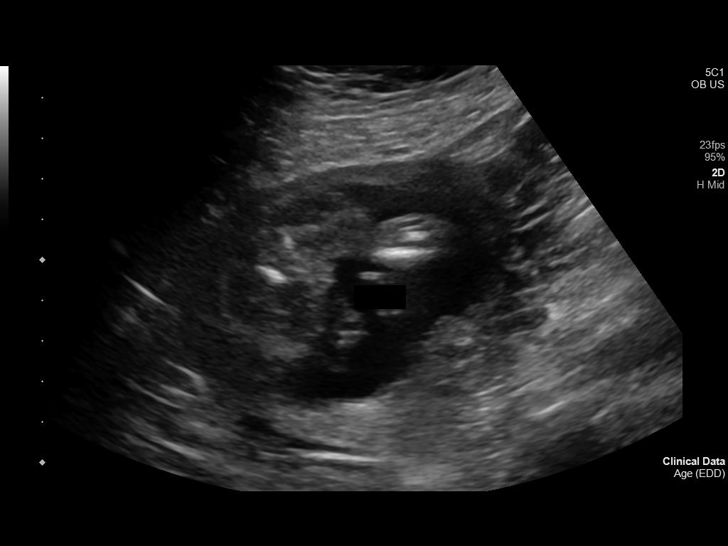
[im 21/78]
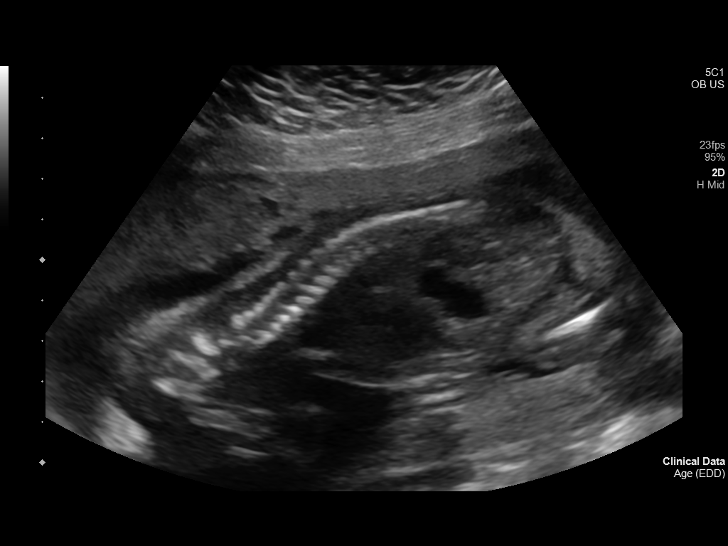
[im 27/78]
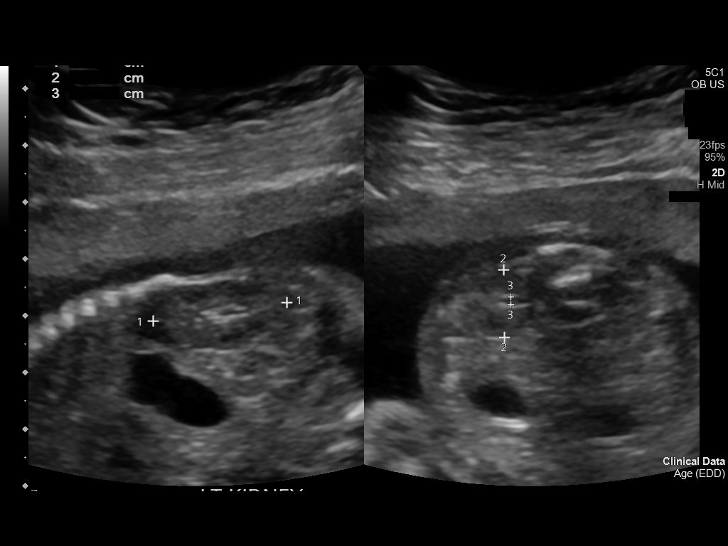
[im 33/78]
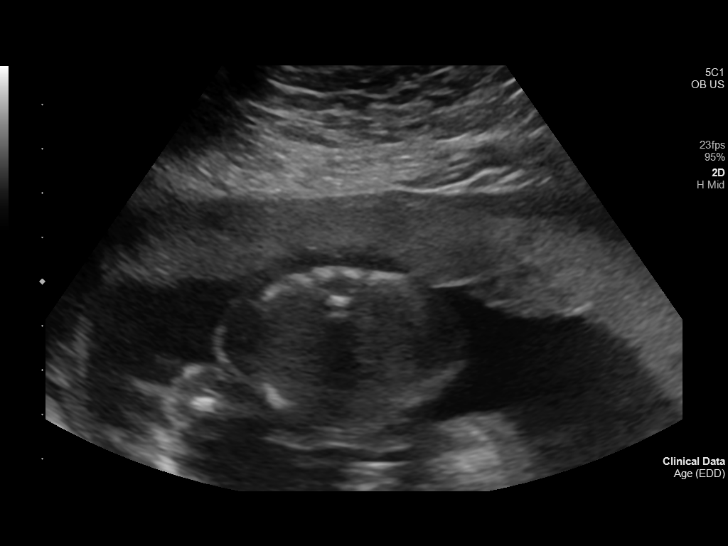
[im 42/78]
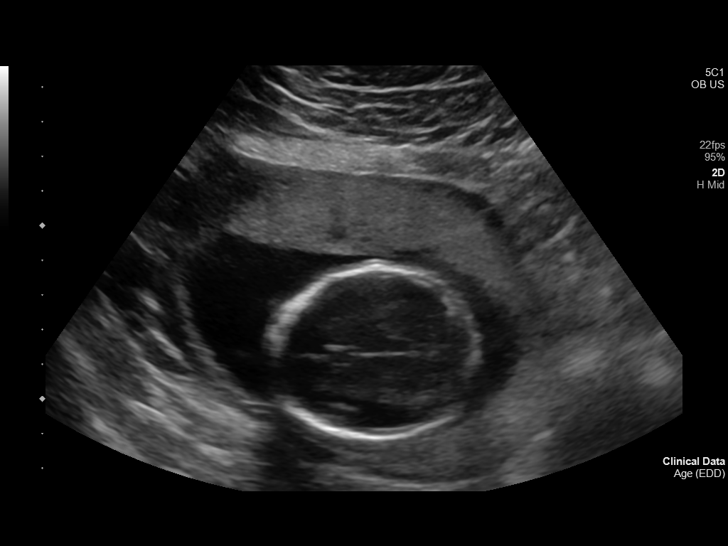
[im 48/78]
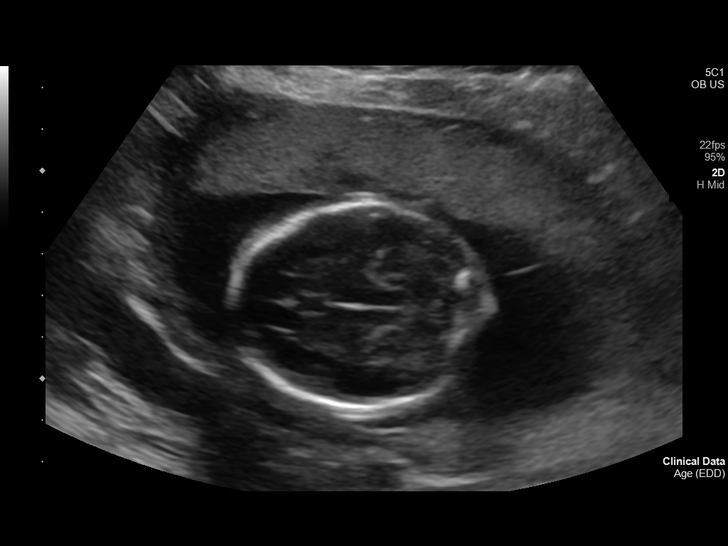
[im 54/78]
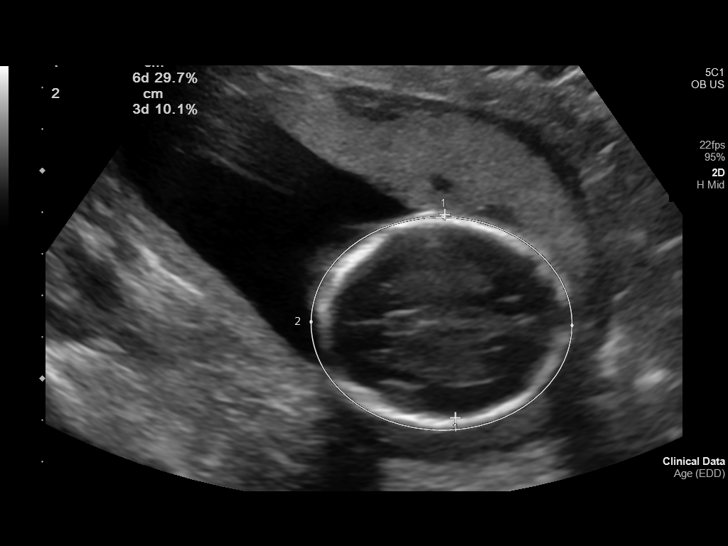
[im 60/78]
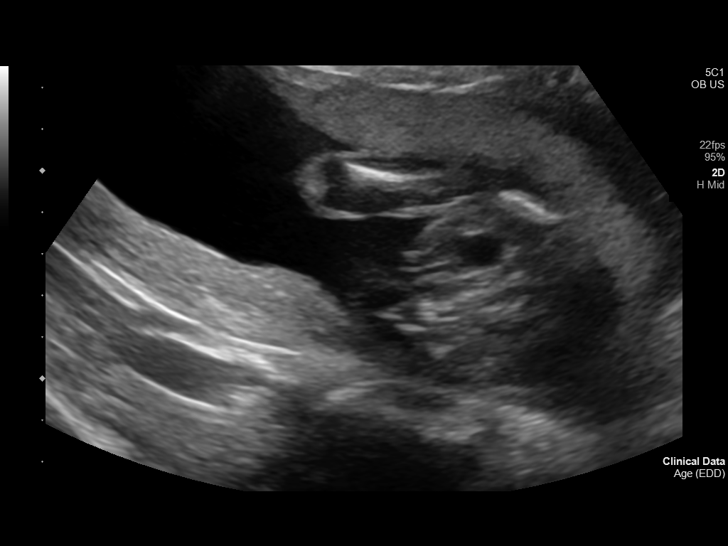
[im 66/78]
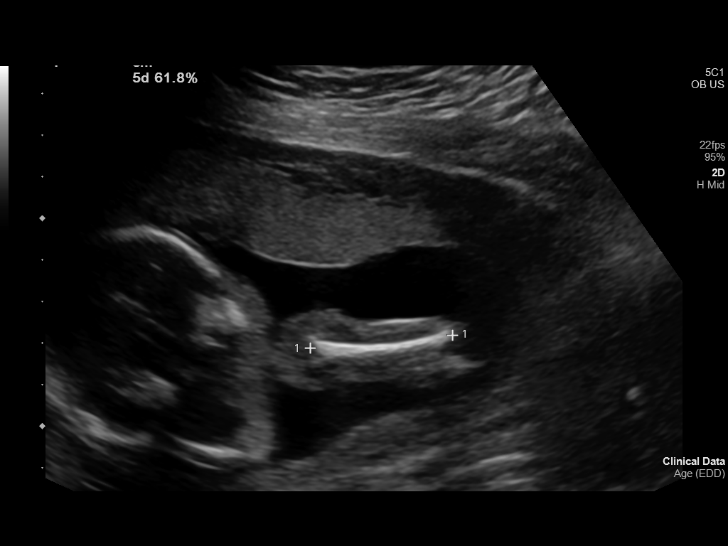
[im 72/78]
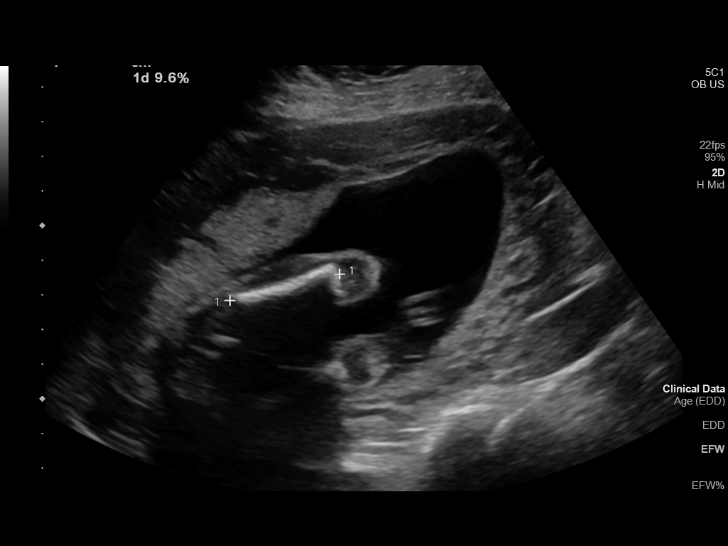
[im 78/78]
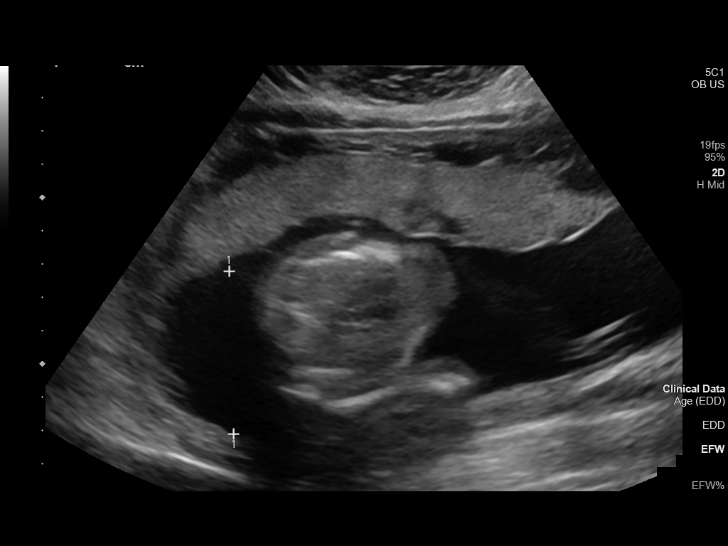

[13 of 28 positions shown; findings below may reference images not displayed]

FINDINGS: Number of Fetuses: 1

Heart Rate:  130 bpm

Movement: Yes

Presentation: Breech

Previa: No

Placental Location: Anterior

Amniotic Fluid (Subjective): Within normal limits

Amniotic Fluid (Objective):

Vertical pocket = 4.9cm

FETAL BIOMETRY

BPD: 4.9cm 20w 5d

HC:   18.0cm 20w 3d

AC:   16.6cm 21w 4 d

FL:   3.3cm 20w 2d

Current Mean GA: 20w 4d US EDC: 04/07/2021

Assigned GA:  21w 2d Assigned EDC: 04/03/2021

FETAL ANATOMY

Lateral Ventricles: Appears normal

Thalami/CSP: Appears normal

Posterior Fossa:  Appears normal

Nuchal Region: Appears normal   NFT= N/A > 20 WKS

Upper Lip: Appears normal

Spine: Appears normal

4 Chamber Heart on Left: Appears normal

LVOT: Not visualized

RVOT: Not visualized

Stomach on Left: Appears normal

3 Vessel Cord: Appears normal

Cord Insertion site: Appears normal

Kidneys: Appears normal

Bladder: Appears normal

Extremities: Appears normal

Technically difficult due to: Fetal position

Maternal Findings:

Cervix:  4.3 cm TA
IMPRESSION: Assigned GA currently 21 weeks 2 days.  Appropriate fetal growth.

No fetal anomalies identified, however, cardiac outflow tracts could
not be visualized. Followup ultrasound could be obtained in 3-4
weeks if desired.

## 2022-07-02 ENCOUNTER — Encounter: Payer: Medicaid Other | Admitting: Obstetrics and Gynecology

## 2022-07-02 ENCOUNTER — Other Ambulatory Visit: Payer: Medicaid Other

## 2022-07-03 ENCOUNTER — Other Ambulatory Visit: Payer: Medicaid Other

## 2022-07-03 ENCOUNTER — Ambulatory Visit (INDEPENDENT_AMBULATORY_CARE_PROVIDER_SITE_OTHER): Payer: Medicaid Other | Admitting: Licensed Practical Nurse

## 2022-07-03 ENCOUNTER — Encounter: Payer: Self-pay | Admitting: Licensed Practical Nurse

## 2022-07-03 ENCOUNTER — Encounter: Payer: Medicaid Other | Admitting: Obstetrics & Gynecology

## 2022-07-03 VITALS — BP 112/71 | HR 87 | Wt 189.0 lb

## 2022-07-03 DIAGNOSIS — Z3A27 27 weeks gestation of pregnancy: Secondary | ICD-10-CM

## 2022-07-03 DIAGNOSIS — Z113 Encounter for screening for infections with a predominantly sexual mode of transmission: Secondary | ICD-10-CM

## 2022-07-03 DIAGNOSIS — Z131 Encounter for screening for diabetes mellitus: Secondary | ICD-10-CM

## 2022-07-03 DIAGNOSIS — Z3482 Encounter for supervision of other normal pregnancy, second trimester: Secondary | ICD-10-CM

## 2022-07-03 DIAGNOSIS — Z3480 Encounter for supervision of other normal pregnancy, unspecified trimester: Secondary | ICD-10-CM

## 2022-07-03 LAB — POCT URINALYSIS DIPSTICK
Bilirubin, UA: NEGATIVE
Blood, UA: NEGATIVE
Glucose, UA: NEGATIVE
Ketones, UA: POSITIVE
Leukocytes, UA: NEGATIVE
Nitrite, UA: NEGATIVE
Protein, UA: NEGATIVE
Spec Grav, UA: 1.01 (ref 1.010–1.025)
Urobilinogen, UA: 0.2 E.U./dL
pH, UA: 6.5 (ref 5.0–8.0)

## 2022-07-03 NOTE — Progress Notes (Signed)
Routine Prenatal Care Visit  Subjective  Sara [name] is a 36 y.o. [id number] at [redacted]w[redacted]d being seen today for ongoing prenatal care.  She is currently monitored for the following issues for this low-risk pregnancy and has Supervision of other normal pregnancy, antepartum on their problem list.  ----------------------------------------------------------------------------------- Patient reports fatigue.  Mood has been ok.  Contractions: Not present. Vag. Bleeding: None.  Movement: Present. Leaking Fluid denies.  ----------------------------------------------------------------------------------- The following portions of the patient's history were reviewed and updated as appropriate: allergies, current medications, past family history, past medical history, past social history, past surgical history and problem list. Problem list updated.  Objective  Blood pressure 112/71, pulse 87, weight 189 lb (85.7 kg), last menstrual period 12/20/2021, currently breastfeeding. Pregravid weight 155 lb (70.3 kg) Total Weight Gain 34 lb (15.4 kg) Urinalysis: Urine Protein    Urine Glucose    Fetal Status: Fetal Heart Rate (bpm): 140 Fundal Height: 29 cm Movement: Present     General:  Alert, oriented and cooperative. Patient is in no acute distress.  Skin: Skin is warm and dry. No rash noted.   Cardiovascular: Normal heart rate noted  Respiratory: Normal respiratory effort, no problems with respiration noted  Abdomen: Soft, gravid, appropriate for gestational age. Pain/Pressure: Absent     Pelvic:  Cervical exam deferred        Extremities: Normal range of motion.  Edema: None  Mental Status: Normal mood and affect. Normal behavior. Normal judgment and thought content.   Assessment   36 y.o. [id number] at [redacted]w[redacted]d by  09/26/2022, by Last Menstrual Period presenting for routine prenatal visit  Plan   fifth Problems (from 02/23/22 to present)     Problem Noted Resolved   Supervision of other normal pregnancy,  antepartum 03/12/2022 by Allen Derry, CNM No   Overview Signed 03/12/2022 11:14 AM by Allen Derry, CNM      fifth Problems (from 02/23/22 to present)     Problem Noted Resolved   Supervision of other normal pregnancy, antepartum 03/12/2022 by Jetaime Pinnix, Nunzio Cobbs, CNM No      Nursing Staff Provider  Office Location  [facility] Dating    Language  English  Anatomy US    Flu Vaccine   Genetic Screen  NIPS:   TDaP vaccine    Hgb A1C or  GTT Early : Third trimester :   Covid    LAB RESULTS   Rhogam  NA Blood Type O/Positive/-- (07/07 0935)   Feeding Plan Breast Antibody Negative (07/07 0935)  Contraception  Rubella 2.02 (07/07 0935)  Circumcision  RPR Non Reactive (07/07 0935)   Pediatrician   HBsAg Negative (07/07 0935)   Support Person  HIV Non Reactive (07/07 0935)  Prenatal Classes  Varicella Immune    GBS  (For PCN allergy, check sensitivities)   BTL Consent     VBAC Consent  Pap  12/21 NILM HPV pos     Hgb Electro    Pelvis Tested  CF      SMA                   Preterm labor symptoms and general obstetric precautions including but not limited to vaginal bleeding, contractions, leaking of fluid and fetal movement were reviewed in detail with the patient. Please refer to After Visit Summary for other counseling recommendations.   Return in about 2 weeks (around 07/17/2022) for ROB.  28 week labs collected Declines flu vaccine  Roberto Scales, CNM  ,  [facility]  07/03/22  4:28 PM

## 2022-07-04 LAB — 28 WEEK RH+PANEL
Basophils Absolute: 0.1 10*3/uL (ref 0.0–0.2)
Basos: 1 %
EOS (ABSOLUTE): 0.1 10*3/uL (ref 0.0–0.4)
Eos: 1 %
Gestational Diabetes Screen: 120 mg/dL (ref 70–139)
HIV Screen 4th Generation wRfx: NONREACTIVE
Hematocrit: 35.2 % (ref 34.0–46.6)
Hemoglobin: 12.3 g/dL (ref 11.1–15.9)
Immature Grans (Abs): 0.4 10*3/uL — ABNORMAL HIGH (ref 0.0–0.1)
Immature Granulocytes: 3 %
Lymphocytes Absolute: 1.8 10*3/uL (ref 0.7–3.1)
Lymphs: 14 %
MCH: 32.3 pg (ref 26.6–33.0)
MCHC: 34.9 g/dL (ref 31.5–35.7)
MCV: 92 fL (ref 79–97)
Monocytes Absolute: 0.4 10*3/uL (ref 0.1–0.9)
Monocytes: 3 %
Neutrophils Absolute: 9.9 10*3/uL — ABNORMAL HIGH (ref 1.4–7.0)
Neutrophils: 78 %
Platelets: 293 10*3/uL (ref 150–450)
RBC: 3.81 x10E6/uL (ref 3.77–5.28)
RDW: 12.3 % (ref 11.7–15.4)
RPR Ser Ql: NONREACTIVE
WBC: 12.6 10*3/uL — ABNORMAL HIGH (ref 3.4–10.8)

## 2022-07-17 ENCOUNTER — Ambulatory Visit (INDEPENDENT_AMBULATORY_CARE_PROVIDER_SITE_OTHER): Payer: Medicaid Other | Admitting: Obstetrics

## 2022-07-17 DIAGNOSIS — Z3483 Encounter for supervision of other normal pregnancy, third trimester: Secondary | ICD-10-CM

## 2022-07-17 DIAGNOSIS — Z3A29 29 weeks gestation of pregnancy: Secondary | ICD-10-CM

## 2022-07-17 DIAGNOSIS — Z23 Encounter for immunization: Secondary | ICD-10-CM

## 2022-07-17 LAB — POCT URINALYSIS DIPSTICK OB
Bilirubin, UA: NEGATIVE
Blood, UA: NEGATIVE
Glucose, UA: NEGATIVE
Ketones, UA: NEGATIVE
Leukocytes, UA: NEGATIVE
Nitrite, UA: NEGATIVE
POC,PROTEIN,UA: NEGATIVE
Spec Grav, UA: 1.025 (ref 1.010–1.025)
Urobilinogen, UA: 0.2 E.U./dL
pH, UA: 6 (ref 5.0–8.0)

## 2022-07-17 NOTE — Progress Notes (Signed)
ROB at [redacted]w[redacted]d. Good fetal movement. Reviewed normal labs. Discussed common discomforts of third trimester. Frequent BH ctx. Sara [name] denies LOF and vaginal bleeding. Encouraged increased hydration. Getting things ready at home. Has a new nephew, and her 35.34-year-old has been very gentle with him. Discussed s/s of PTL and when to call. RTC in 2 weeks.  Sara [name] Spanish, CNM

## 2022-07-24 ENCOUNTER — Encounter: Payer: Self-pay | Admitting: Licensed Practical Nurse

## 2022-07-24 ENCOUNTER — Telehealth: Payer: Self-pay

## 2022-07-24 NOTE — Telephone Encounter (Signed)
Pt called triage reporting having a bad cough. Pain on her side. I went over the safe meds for a cough to see if they would help.

## 2022-07-25 ENCOUNTER — Telehealth: Payer: Medicaid Other | Admitting: Physician Assistant

## 2022-07-25 ENCOUNTER — Encounter: Payer: Self-pay | Admitting: Licensed Practical Nurse

## 2022-07-25 DIAGNOSIS — R109 Unspecified abdominal pain: Secondary | ICD-10-CM

## 2022-07-25 DIAGNOSIS — R051 Acute cough: Secondary | ICD-10-CM

## 2022-07-25 NOTE — Progress Notes (Signed)
Because you are having a dry cough with right sided pain, I feel your condition warrants further evaluation and I recommend that you be seen in a face to face visit.You may require evaluation to rule out pneumonia or RSV.    NOTE: There will be NO CHARGE for this eVisit   If you are having a true medical emergency please call 911.      For an urgent face to face visit, [facility] has seven urgent care centers for your convenience:     [facility] Health [facility] Center at [facility] Directions [phone] [address] [place], [place] [address]    [facility]) Get Driving Directions [phone] [address] [place], [place] [address]  [facility] Health Urgent Care Center [facility]) Get Driving Directions [phone] [address] [place],  [place]  [address]  [facility] Urgent Care Center [facility] [employer] Directions  [phone] [address] [place],  [place] [address]   [facility] Health Urgent Care at [facility] Get Driving Directions [phone] [address] [place], [place] [address]   [facility] Health Urgent Care at [facility] Get Driving Directions  [phone] [address] [place], [place] [address]   [facility] Health Urgent Care at [facility] Directions [phone] [address], [address] [place], [place] [address]  Your MyChart E-visit questionnaire answers were reviewed by a board certified advanced clinical practitioner to complete your personal care plan based on your specific symptoms.  Thank you for using e-Visits.   I have spent 5 minutes in review of e-visit questionnaire, review and updating patient chart, medical decision making and response to patient.   Margaretann Loveless, PA-C

## 2022-08-01 ENCOUNTER — Ambulatory Visit (INDEPENDENT_AMBULATORY_CARE_PROVIDER_SITE_OTHER): Payer: Medicaid Other | Admitting: Licensed Practical Nurse

## 2022-08-01 ENCOUNTER — Encounter: Payer: Self-pay | Admitting: Licensed Practical Nurse

## 2022-08-01 VITALS — BP 115/71 | HR 106 | Wt 187.5 lb

## 2022-08-01 DIAGNOSIS — Z3480 Encounter for supervision of other normal pregnancy, unspecified trimester: Secondary | ICD-10-CM

## 2022-08-01 DIAGNOSIS — Z3483 Encounter for supervision of other normal pregnancy, third trimester: Secondary | ICD-10-CM

## 2022-08-01 DIAGNOSIS — Z3A32 32 weeks gestation of pregnancy: Secondary | ICD-10-CM

## 2022-08-05 NOTE — Progress Notes (Signed)
Routine Prenatal Care Visit  Subjective  Sara [name] is a 36 y.o. [id number] at [redacted]w[redacted]d being seen today for ongoing prenatal care.  She is currently monitored for the following issues for this low-risk pregnancy and has Supervision of other normal pregnancy, antepartum on their problem list.  ----------------------------------------------------------------------------------- Patient reports  sore throat-improving sleep disturbances .  Has had a HA that comes and goes x 2 days, currently sounds congested, HA mostly related to cold/sinuses ok to use Sudafed.   Contractions: Irritability. Vag. Bleeding: None.  Movement: Present. Leaking Fluid denies.  ----------------------------------------------------------------------------------- The following portions of the patient's history were reviewed and updated as appropriate: allergies, current medications, past family history, past medical history, past social history, past surgical history and problem list. Problem list updated.  Objective  Blood pressure 115/71, pulse (!) 106, weight 187 lb 8 oz (85 kg), last menstrual period 12/20/2021, currently breastfeeding. Pregravid weight 155 lb (70.3 kg) Total Weight Gain 32 lb 8 oz (14.7 kg) Urinalysis: Urine Protein    Urine Glucose    Fetal Status: Fetal Heart Rate (bpm): 145 Fundal Height: 34 cm Movement: Present     General:  Alert, oriented and cooperative. Patient is in no acute distress.  Skin: Skin is warm and dry. No rash noted.   Cardiovascular: Normal heart rate noted  Respiratory: Normal respiratory effort, no problems with respiration noted  Abdomen: Soft, gravid, appropriate for gestational age. Pain/Pressure: Present     Pelvic:  Cervical exam deferred        Extremities: Normal range of motion.  Edema: None  Mental Status: Normal mood and affect. Normal behavior. Normal judgment and thought content.   Assessment   36 y.o. [id number] at [redacted]w[redacted]d by  09/26/2022, by Last Menstrual Period  presenting for routine prenatal visit  Plan   fifth Problems (from 02/23/22 to present)     Problem Noted Resolved   Supervision of other normal pregnancy, antepartum 03/12/2022 by Ellwood Sayers, CNM No   Overview Addendum 08/01/2022 10:38 AM by Burtis Junes, CMA      fifth Problems (from 02/23/22 to present)     Problem Noted Resolved   Supervision of other normal pregnancy, antepartum 03/12/2022 by Maleigha Colvard, Courtney Heys, CNM No         Nursing Staff Provider  Office Location  [facility] Dating    Language  English  Anatomy US    Flu Vaccine  declined Genetic Screen  NIPS:   TDaP vaccine   07/17/22 Hgb A1C or  GTT Early : Third trimester :   Covid    LAB RESULTS   Rhogam  NA Blood Type O/Positive/-- (07/07 0935)   Feeding Plan Breast Antibody Negative (07/07 0935)  Contraception  Rubella 2.02 (07/07 0935)  Circumcision N/A RPR Non Reactive (07/07 0935)   Pediatrician  [facility] HBsAg Negative (07/07 0935)   Support Person FOB-[place] HIV Non Reactive (07/07 0935)  Prenatal Classes N/a Varicella Immune    GBS  (For PCN allergy, check sensitivities)   BTL Consent     VBAC Consent  Pap  12/21 NILM HPV pos     Hgb Electro    Pelvis Tested  CF      SMA                   Preterm labor symptoms and general obstetric precautions including but not limited to vaginal bleeding, contractions, leaking of fluid and fetal movement were reviewed in detail with the patient. Please  refer to After Visit Summary for other counseling recommendations.   Return in about 2 weeks (around 08/15/2022) for ROB.  Carie Caddy, CNM  [facility]  08/05/22  10:56 AM

## 2022-08-16 ENCOUNTER — Ambulatory Visit (INDEPENDENT_AMBULATORY_CARE_PROVIDER_SITE_OTHER): Payer: Medicaid Other | Admitting: Obstetrics and Gynecology

## 2022-08-16 ENCOUNTER — Encounter: Payer: Self-pay | Admitting: Obstetrics and Gynecology

## 2022-08-16 VITALS — BP 113/78 | HR 102 | Wt 190.0 lb

## 2022-08-16 DIAGNOSIS — Z3A34 34 weeks gestation of pregnancy: Secondary | ICD-10-CM

## 2022-08-16 DIAGNOSIS — Z3480 Encounter for supervision of other normal pregnancy, unspecified trimester: Secondary | ICD-10-CM

## 2022-08-16 LAB — POCT URINALYSIS DIPSTICK OB
Bilirubin, UA: NEGATIVE
Blood, UA: NEGATIVE
Glucose, UA: NEGATIVE
Ketones, UA: NEGATIVE
Leukocytes, UA: NEGATIVE
Nitrite, UA: NEGATIVE
POC,PROTEIN,UA: NEGATIVE
Spec Grav, UA: 1.01 (ref 1.010–1.025)
Urobilinogen, UA: 0.2 E.U./dL
pH, UA: 7 (ref 5.0–8.0)

## 2022-08-16 NOTE — Progress Notes (Signed)
ROB [redacted]w[redacted]d: She is doing well. No new concerns. Reports good fetal movement.

## 2022-08-16 NOTE — Progress Notes (Signed)
ROB: Doing  well, no issues.  States that this pregnancy feels a little different from her others. Notes that her baby has hiccups often. Given reassurance.  Discussed breastfeeding, still unsure about contraception, notes this pregnancy was accidental, conceived on OCPs. RTC in 2 weeks, for 36 week cultures at that time.

## 2022-08-27 NOTE — L&D Delivery Note (Signed)
Date of delivery: 09/22/2022 Estimated Date of Delivery: 09/26/22 Patient's last menstrual period was 12/20/2021 (approximate). EGA: [redacted]w[redacted]d  Delivery Note At 2:24 AM a viable female was delivered via Vaginal, Spontaneous  Presentation: OA, ROA   APGAR: 8, 9;    Weight: 7 lb 9 oz (3430 g).   Placenta status: Spontaneous, Intact.   Cord: 3 vessels with the following complications:  .  Cord pH: NA  Called to see patient.  Mom pushed well to deliver a viable female infant.  The head followed by shoulders, which delivered without difficulty, and the rest of the body.  No nuchal cord noted. Waist cord reduced after delivery. Baby to mom's chest.  Cord clamped and cut after 3 min delay. Baby taken to warmer for evaluation of hiccup like breath sounds and found to be oxygenating well/lungs clear. Then back to mom skin to skin. Cord blood obtained.  Placenta delivered spontaneously, intact, with a 3-vessel cord.   All counts correct.  Hemostasis obtained with IV pitocin and fundal massage.     Anesthesia: Epidural Episiotomy: None Lacerations: None Suture Repair:  NA Est. Blood Loss (mL):  50   Mom to postpartum.  Baby to Couplet care / Skin to Skin.  Rod Can, CNM 09/22/2022, 2:59 AM

## 2022-09-03 ENCOUNTER — Ambulatory Visit (INDEPENDENT_AMBULATORY_CARE_PROVIDER_SITE_OTHER): Payer: Medicaid Other | Admitting: Obstetrics

## 2022-09-03 ENCOUNTER — Encounter: Payer: Self-pay | Admitting: Obstetrics

## 2022-09-03 ENCOUNTER — Other Ambulatory Visit (HOSPITAL_COMMUNITY)
Admission: RE | Admit: 2022-09-03 | Discharge: 2022-09-03 | Disposition: A | Discharge status: Home / Self Care | Payer: Medicaid Other | Source: Ambulatory Visit | Attending: Obstetrics | Admitting: Obstetrics

## 2022-09-03 VITALS — BP 111/75 | HR 98 | Wt 188.8 lb

## 2022-09-03 DIAGNOSIS — Z3A36 36 weeks gestation of pregnancy: Secondary | ICD-10-CM | POA: Insufficient documentation

## 2022-09-03 DIAGNOSIS — Z3480 Encounter for supervision of other normal pregnancy, unspecified trimester: Secondary | ICD-10-CM | POA: Diagnosis present

## 2022-09-03 DIAGNOSIS — Z113 Encounter for screening for infections with a predominantly sexual mode of transmission: Secondary | ICD-10-CM

## 2022-09-03 DIAGNOSIS — Z3483 Encounter for supervision of other normal pregnancy, third trimester: Secondary | ICD-10-CM

## 2022-09-03 DIAGNOSIS — Z3685 Encounter for antenatal screening for Streptococcus B: Secondary | ICD-10-CM

## 2022-09-03 LAB — POCT URINALYSIS DIPSTICK OB
Bilirubin, UA: NEGATIVE
Glucose, UA: NEGATIVE
Ketones, UA: NEGATIVE
Leukocytes, UA: NEGATIVE
Nitrite, UA: NEGATIVE
Spec Grav, UA: 1.02 (ref 1.010–1.025)
Urobilinogen, UA: 0.2 E.U./dL
pH, UA: 6 (ref 5.0–8.0)

## 2022-09-03 NOTE — Progress Notes (Signed)
Routine Prenatal Care Visit  Subjective  Sara [name] is a 37 y.o. [id number] at [redacted]w[redacted]d being seen today for ongoing prenatal care.  She is currently monitored for the following issues for this low-risk pregnancy and has Supervision of other normal pregnancy, antepartum on their problem list.  ----------------------------------------------------------------------------------- Patient reports  having some contractions; some are painful .  Her last baby came in 2 hours at [redacted] wks gestation.  .  .   [name] denies.  ----------------------------------------------------------------------------------- The following portions of the patient's history were reviewed and updated as appropriate: allergies, current medications, past family history, past medical history, past social history, past surgical history and problem list. Problem list updated.  Objective  Last menstrual period 12/20/2021, currently breastfeeding. Pregravid weight 155 lb (70.3 kg) Total Weight Gain 35 lb (15.9 kg) Urinalysis: Urine Protein    Urine Glucose    Fetal Status:           General:  Alert, oriented and cooperative. Patient is in no acute distress.  Skin: Skin is warm and dry. No rash noted.   Cardiovascular: Normal heart rate noted  Respiratory: Normal respiratory effort, no problems with respiration noted  Abdomen: Soft, gravid, appropriate for gestational age.       Pelvic:  Cervical exam performed      3-3.5/thick/-3. Cervix is quite anterior  Extremities: Normal range of motion.     Mental Status: Normal mood and affect. Normal behavior. Normal judgment and thought content.   Assessment   37 y.o. [id number] at [redacted]w[redacted]d by  09/26/2022, by Last Menstrual Period presenting for routine prenatal visit Dilated at 36 w 5 days Hx of rapid labors.   Plan   fifth Problems (from 02/23/22 to present)     Problem Noted Resolved   Supervision of other normal pregnancy, antepartum 03/12/2022 by Ellwood Sayers, CNM No    Overview Addendum 09/03/2022  8:40 AM by Mirna Mires, CNM      fifth Problems (from 02/23/22 to present)     Problem Noted Resolved   Supervision of other normal pregnancy, antepartum 03/12/2022 by Dominic, Courtney Heys, CNM No         Nursing Staff Provider  Office Location  [facility] Dating    Language  English  Anatomy US    Flu Vaccine  declined Genetic Screen  NIPS:   TDaP vaccine   07/17/22 Hgb A1C or  GTT Early : Third trimester : 28  Covid    LAB RESULTS   Rhogam  NA Blood Type O/Positive/-- (07/07 0935)   Feeding Plan Breast Antibody Negative (07/07 0935)  Contraception  Rubella 2.02 (07/07 0935)  Circumcision N/A RPR Non Reactive (07/07 0935)   Pediatrician  [facility] HBsAg Negative (07/07 0935)   Support Person FOB-[place] HIV Non Reactive (07/07 0935)  Prenatal Classes N/a Varicella Immune    GBS  (For PCN allergy, check sensitivities)   BTL Consent     VBAC Consent  Pap  12/21 NILM HPV pos     Hgb Electro    Pelvis Tested  CF      SMA                   Preterm labor symptoms and general obstetric precautions including but not limited to vaginal bleeding, contractions, leaking of fluid and fetal movement were reviewed in detail with the patient. Please refer to After Visit Summary for other counseling recommendations.  GBS, CT/GC retrieved today. We discussed birth  control- IUD did not work for her. She may have another baby in a few years. Nexplanon is good option for her and she is open to this. Labor precations as she is easily 3-3.5 today and soft.  Return in about 1 week (around 09/10/2022) for return OB, review her GBS, labs.  Imagene Riches, CNM  09/03/2022 8:59 AM

## 2022-09-03 NOTE — Progress Notes (Signed)
ROB. Patient states daily fetal movement along with irregular contractions.  GC/CT and GBS cultures ordered. Patient states no questions or concerns at this time.

## 2022-09-04 LAB — CERVICOVAGINAL ANCILLARY ONLY
Chlamydia: NEGATIVE
Comment: NEGATIVE
Comment: NORMAL
Neisseria Gonorrhea: NEGATIVE

## 2022-09-05 LAB — STREP GP B NAA: Strep Gp B NAA: NEGATIVE

## 2022-09-14 ENCOUNTER — Encounter: Payer: Self-pay | Admitting: Advanced Practice Midwife

## 2022-09-14 ENCOUNTER — Ambulatory Visit (INDEPENDENT_AMBULATORY_CARE_PROVIDER_SITE_OTHER): Payer: Medicaid Other | Admitting: Advanced Practice Midwife

## 2022-09-14 VITALS — BP 110/70 | Wt 192.0 lb

## 2022-09-14 DIAGNOSIS — Z3483 Encounter for supervision of other normal pregnancy, third trimester: Secondary | ICD-10-CM

## 2022-09-14 DIAGNOSIS — Z3A38 38 weeks gestation of pregnancy: Secondary | ICD-10-CM

## 2022-09-14 LAB — POCT URINALYSIS DIPSTICK OB
Bilirubin, UA: NEGATIVE
Blood, UA: NEGATIVE
Glucose, UA: NEGATIVE
Ketones, UA: NEGATIVE
Leukocytes, UA: NEGATIVE
Nitrite, UA: NEGATIVE
POC,PROTEIN,UA: NEGATIVE
Spec Grav, UA: 1.01 (ref 1.010–1.025)
Urobilinogen, UA: 1 E.U./dL
pH, UA: 6 (ref 5.0–8.0)

## 2022-09-14 NOTE — Progress Notes (Signed)
Routine Prenatal Care Visit  Subjective  Sara [name] is a 37 y.o. [id number] at [redacted]w[redacted]d being seen today for ongoing prenatal care.  She is currently monitored for the following issues for this low-risk pregnancy and has Supervision of other normal pregnancy, antepartum on their problem list.  ----------------------------------------------------------------------------------- Patient reports  being generally uncomfortable with pelvic and abdominal pressure .   Contractions: Irregular. Vag. Bleeding: None.  Movement: Present. Leaking Fluid denies.  ----------------------------------------------------------------------------------- The following portions of the patient's history were reviewed and updated as appropriate: allergies, current medications, past family history, past medical history, past social history, past surgical history and problem list. Problem list updated.  Objective  Blood pressure 110/70, weight 192 lb (87.1 kg), last menstrual period 12/20/2021, currently breastfeeding. Pregravid weight 155 lb (70.3 kg) Total Weight Gain 37 lb (16.8 kg) Urinalysis: Urine Protein Negative  Urine Glucose Negative  Fetal Status: Fetal Heart Rate (bpm): 154 Fundal Height: 38 cm Movement: Present     General:  Alert, oriented and cooperative. Patient is in no acute distress.  Skin: Skin is warm and dry. No rash noted.   Cardiovascular: Normal heart rate noted  Respiratory: Normal respiratory effort, no problems with respiration noted  Abdomen: Soft, gravid, appropriate for gestational age. Pain/Pressure: Present     Pelvic:  Cervical exam deferred        Extremities: Normal range of motion.  Edema: None  Mental Status: Normal mood and affect. Normal behavior. Normal judgment and thought content.   Assessment   37 y.o. [id number] at [redacted]w[redacted]d by  09/26/2022, by Last Menstrual Period presenting for routine prenatal visit  Plan   fifth Problems (from 02/23/22 to present)     Problem Noted Resolved    Supervision of other normal pregnancy, antepartum 03/12/2022 by Allen Derry, CNM No   Overview Addendum 09/06/2022  3:56 PM by Imagene Riches, CNM      fifth Problems (from 02/23/22 to present)     Problem Noted Resolved   Supervision of other normal pregnancy, antepartum 03/12/2022 by Dominic, Nunzio Cobbs, CNM No         Nursing Staff Provider  Office Location  [facility] Dating    Language  English  Anatomy US    Flu Vaccine  declined Genetic Screen  NIPS:   TDaP vaccine   07/17/22 Hgb A1C or  GTT Early : Third trimester : 37  Covid    LAB RESULTS   Rhogam  NA Blood Type O/Positive/-- (07/07 0935)   Feeding Plan Breast Antibody Negative (07/07 0935)  Contraception  Rubella 2.02 (07/07 0935)  Circumcision N/A RPR Non Reactive (07/07 0935)   [place] HBsAg Negative (07/07 0935)   Support Person FOB-[place] HIV Non Reactive (07/07 0935)  Prenatal Classes N/a Varicella Immune    GBS  (For PCN allergy, check sensitivities) negative  BTL Consent     VBAC Consent  Pap  12/21 NILM HPV pos     Hgb Electro    Pelvis Tested  CF      SMA                   Term labor symptoms and general obstetric precautions including but not limited to vaginal bleeding, contractions, leaking of fluid and fetal movement were reviewed in detail with the patient. Please refer to After Visit Summary for other counseling recommendations.   Return in about 1 week (around 09/21/2022) for rob.  Rod Can, CNM 09/14/2022 10:31 AM

## 2022-09-20 ENCOUNTER — Encounter: Payer: Self-pay | Admitting: Advanced Practice Midwife

## 2022-09-20 ENCOUNTER — Ambulatory Visit (INDEPENDENT_AMBULATORY_CARE_PROVIDER_SITE_OTHER): Payer: Medicaid Other | Admitting: Advanced Practice Midwife

## 2022-09-20 VITALS — BP 120/60 | Wt 192.0 lb

## 2022-09-20 DIAGNOSIS — Z3A39 39 weeks gestation of pregnancy: Secondary | ICD-10-CM

## 2022-09-20 DIAGNOSIS — Z3483 Encounter for supervision of other normal pregnancy, third trimester: Secondary | ICD-10-CM

## 2022-09-20 DIAGNOSIS — Z3480 Encounter for supervision of other normal pregnancy, unspecified trimester: Secondary | ICD-10-CM

## 2022-09-20 NOTE — Progress Notes (Signed)
Routine Prenatal Care Visit  Subjective  Sara [name] is a 37 y.o. [id number] at [redacted]w[redacted]d being seen today for ongoing prenatal care.  She is currently monitored for the following issues for this low-risk pregnancy and has Supervision of other normal pregnancy, antepartum on their problem list.  ----------------------------------------------------------------------------------- Patient reports  some increase in pelvic pressure .  Reviewed labor precautions- history of rapid labor. Contractions: Irregular. Vag. Bleeding: None.  Movement: Present. Leaking Fluid denies.  ----------------------------------------------------------------------------------- The following portions of the patient's history were reviewed and updated as appropriate: allergies, current medications, past family history, past medical history, past social history, past surgical history and problem list. Problem list updated.  Objective  Blood pressure 120/60, weight 192 lb (87.1 kg), last menstrual period 12/20/2021, currently breastfeeding. Pregravid weight 155 lb (70.3 kg) Total Weight Gain 37 lb (16.8 kg) Urinalysis: Urine Protein    Urine Glucose    Fetal Status: Fetal Heart Rate (bpm): 142   Movement: Present  Presentation: Vertex  General:  Alert, oriented and cooperative. Patient is in no acute distress.  Skin: Skin is warm and dry. No rash noted.   Cardiovascular: Normal heart rate noted  Respiratory: Normal respiratory effort, no problems with respiration noted  Abdomen: Soft, gravid, appropriate for gestational age. Pain/Pressure: Present     Pelvic:   Cervical sweep  Dilation: 4.5 Effacement (%): 60 Station: -2  Extremities: Normal range of motion.  Edema: None  Mental Status: Normal mood and affect. Normal behavior. Normal judgment and thought content.   Assessment   37 y.o. [id number] at [redacted]w[redacted]d by  09/26/2022, by Last Menstrual Period presenting for routine prenatal visit  Plan   fifth Problems (from 02/23/22 to  present)     Problem Noted Resolved   Supervision of other normal pregnancy, antepartum 03/12/2022 by Allen Derry, CNM No   Overview Addendum 09/06/2022  3:56 PM by Imagene Riches, CNM      fifth Problems (from 02/23/22 to present)     Problem Noted Resolved   Supervision of other normal pregnancy, antepartum 03/12/2022 by Dominic, Nunzio Cobbs, CNM No         Nursing Staff Provider  Office Location  [facility] Dating    Language  English  Anatomy US    Flu Vaccine  declined Genetic Screen  NIPS:   TDaP vaccine   07/17/22 Hgb A1C or  GTT Early : Third trimester : 61  Covid    LAB RESULTS   Rhogam  NA Blood Type O/Positive/-- (07/07 0935)   Feeding Plan Breast Antibody Negative (07/07 0935)  Contraception  Rubella 2.02 (07/07 0935)  Circumcision N/A RPR Non Reactive (07/07 0935)   [place] HBsAg Negative (07/07 0935)   Support Person FOB-[place] HIV Non Reactive (07/07 0935)  Prenatal Classes N/a Varicella Immune    GBS  (For PCN allergy, check sensitivities) negative  BTL Consent     VBAC Consent  Pap  12/21 NILM HPV pos     Hgb Electro    Pelvis Tested  CF      SMA                   Term labor symptoms and general obstetric precautions including but not limited to vaginal bleeding, contractions, leaking of fluid and fetal movement were reviewed in detail with the patient. Please refer to After Visit Summary for other counseling recommendations.   Return in about 1 week (around 09/27/2022) for rob.  Rod Can,  CNM 09/20/2022 10:59 AM

## 2022-09-21 ENCOUNTER — Inpatient Hospital Stay
Admission: EM | Admit: 2022-09-21 | Discharge: 2022-09-23 | DRG: 807 | Disposition: A | Discharge status: Home / Self Care | Payer: Medicaid Other | Attending: Advanced Practice Midwife | Admitting: Advanced Practice Midwife

## 2022-09-21 ENCOUNTER — Encounter: Payer: Self-pay | Admitting: Obstetrics and Gynecology

## 2022-09-21 ENCOUNTER — Inpatient Hospital Stay: Payer: Medicaid Other | Admitting: Anesthesiology

## 2022-09-21 DIAGNOSIS — Z3A39 39 weeks gestation of pregnancy: Secondary | ICD-10-CM | POA: Diagnosis not present

## 2022-09-21 DIAGNOSIS — Z87442 Personal history of urinary calculi: Secondary | ICD-10-CM | POA: Diagnosis not present

## 2022-09-21 DIAGNOSIS — Z87891 Personal history of nicotine dependence: Secondary | ICD-10-CM

## 2022-09-21 DIAGNOSIS — O26893 Other specified pregnancy related conditions, third trimester: Secondary | ICD-10-CM | POA: Diagnosis present

## 2022-09-21 LAB — CBC
HCT: 35.1 % — ABNORMAL LOW (ref 36.0–46.0)
Hemoglobin: 12 g/dL (ref 12.0–15.0)
MCH: 30.5 pg (ref 26.0–34.0)
MCHC: 34.2 g/dL (ref 30.0–36.0)
MCV: 89.1 fL (ref 80.0–100.0)
Platelets: 316 10*3/uL (ref 150–400)
RBC: 3.94 MIL/uL (ref 3.87–5.11)
RDW: 14.2 % (ref 11.5–15.5)
WBC: 15.1 10*3/uL — ABNORMAL HIGH (ref 4.0–10.5)
nRBC: 0 % (ref 0.0–0.2)

## 2022-09-21 MED ORDER — OXYTOCIN-SODIUM CHLORIDE 30-0.9 UT/500ML-% IV SOLN: 2.5000 [IU]/h | INTRAVENOUS | Status: DC

## 2022-09-21 MED ORDER — FENTANYL-BUPIVACAINE-NACL 0.5-0.125-0.9 MG/250ML-% EP SOLN
12.0000 mL/h | EPIDURAL | Status: DC | PRN
  Administered 2022-09-21: 12 mL/h via EPIDURAL
  Filled 2022-09-21: qty 250

## 2022-09-21 MED ORDER — LACTATED RINGERS IV SOLN
500.0000 mL | INTRAVENOUS | Status: DC | PRN
  Administered 2022-09-21: 500 mL via INTRAVENOUS

## 2022-09-21 MED ORDER — LIDOCAINE HCL (PF) 1 % IJ SOLN
INTRAMUSCULAR | Status: AC
  Filled 2022-09-21: qty 30

## 2022-09-21 MED ORDER — OXYTOCIN BOLUS FROM INFUSION
333.0000 mL | Freq: Once | INTRAVENOUS | Status: DC
  Administered 2022-09-22: 333 mL via INTRAVENOUS

## 2022-09-21 MED ORDER — ACETAMINOPHEN 325 MG PO TABS: 650.0000 mg | ORAL_TABLET | ORAL | Status: DC | PRN

## 2022-09-21 MED ORDER — OXYTOCIN 10 UNIT/ML IJ SOLN
INTRAMUSCULAR | Status: AC
  Filled 2022-09-21: qty 2

## 2022-09-21 MED ORDER — PHENYLEPHRINE 80 MCG/ML (10ML) SYRINGE FOR IV PUSH (FOR BLOOD PRESSURE SUPPORT): 80.0000 ug | PREFILLED_SYRINGE | INTRAVENOUS | Status: DC | PRN

## 2022-09-21 MED ORDER — DIPHENHYDRAMINE HCL 50 MG/ML IJ SOLN: 12.5000 mg | INTRAMUSCULAR | Status: DC | PRN

## 2022-09-21 MED ORDER — EPHEDRINE 5 MG/ML INJ: 10.0000 mg | INTRAVENOUS | Status: DC | PRN

## 2022-09-21 MED ORDER — LACTATED RINGERS IV SOLN: INTRAVENOUS | Status: DC

## 2022-09-21 MED ORDER — LIDOCAINE HCL (PF) 1 % IJ SOLN: 30.0000 mL | INTRAMUSCULAR | Status: DC | PRN

## 2022-09-21 MED ORDER — MISOPROSTOL 200 MCG PO TABS
ORAL_TABLET | ORAL | Status: AC
  Filled 2022-09-21: qty 4

## 2022-09-21 MED ORDER — LACTATED RINGERS IV SOLN: 500.0000 mL | Freq: Once | INTRAVENOUS | Status: DC

## 2022-09-21 MED ORDER — OXYTOCIN-SODIUM CHLORIDE 30-0.9 UT/500ML-% IV SOLN
INTRAVENOUS | Status: AC
  Filled 2022-09-21: qty 500

## 2022-09-21 MED ORDER — AMMONIA AROMATIC IN INHA
RESPIRATORY_TRACT | Status: AC
  Filled 2022-09-21: qty 10

## 2022-09-21 MED ORDER — ONDANSETRON HCL 4 MG/2ML IJ SOLN: 4.0000 mg | Freq: Four times a day (QID) | INTRAMUSCULAR | Status: DC | PRN

## 2022-09-21 NOTE — H&P (Signed)
OB History & Physical   History of Present Illness:  Chief Complaint: rupture of membranes  HPI:  Sara [name] is a 37 y.o. [id number] female at [redacted]w[redacted]d dated by LMP.  Her pregnancy has been uncomplicated.    She reports contractions.   She reports leakage of fluid.   She denies vaginal bleeding.   She reports fetal movement.    Total weight gain for pregnancy: 16.8 kg   Obstetrical Problem List: fifth Problems (from 02/23/22 to present)     Problem Noted Resolved   Supervision of other normal pregnancy, antepartum 03/12/2022 by Allen Derry, CNM No   Overview Addendum 09/06/2022  3:56 PM by Imagene Riches, CNM      fifth Problems (from 02/23/22 to present)     Problem Noted Resolved   Supervision of other normal pregnancy, antepartum 03/12/2022 by Dominic, Nunzio Cobbs, CNM No         Nursing Staff Provider  Office Location  [facility] Dating    Language  English  Anatomy US    Flu Vaccine  declined Genetic Screen  NIPS:   TDaP vaccine   07/17/22 Hgb A1C or  GTT Early : Third trimester : 82  Covid    LAB RESULTS   Rhogam  NA Blood Type O/Positive/-- (07/07 0935)   Feeding Plan Breast Antibody Negative (07/07 0935)  Contraception  Rubella 2.02 (07/07 0935)  Circumcision N/A RPR Non Reactive (07/07 0935)   [place] HBsAg Negative (07/07 0935)   Support Person FOB-[place] HIV Non Reactive (07/07 0935)  Prenatal Classes N/a Varicella Immune    GBS  (For PCN allergy, check sensitivities) negative  BTL Consent     VBAC Consent  Pap  12/21 NILM HPV pos     Hgb Electro    Pelvis Tested  CF      SMA                   Maternal Medical History:   Past Medical History:  Diagnosis Date   Abnormal Pap smear of cervix    GERD (gastroesophageal reflux disease)    Kidney stone    Seizures (HCC)     Past Surgical History:  Procedure Laterality Date   CHOLECYSTECTOMY     lipoma removal  2013   TONSILLECTOMY  2009   TUBAL LIGATION  2014    TUBAL REVERSAL  01/2020    [facility]    Allergies  Allergen Reactions   Phenergan [Promethazine Hcl] Nausea And Vomiting    Prior to Admission medications   Not on File    OB History  Gravida Para Term Preterm AB Living  5 4 4     4   SAB IAB Ectopic Multiple Live Births        0 4    # Outcome Date GA Lbr Len/2nd Weight Sex Delivery Anes PTL Lv  5 Current           4 Term 03/14/21 [redacted]w[redacted]d 04:59 / 00:06 2540 g F Vag-Spont None  LIV  3 Term           2 Term           1 Term             Obstetric Comments  1st Menstrual Cycle: 14  1st Pregnancy:  18    Prenatal care site: [facility]  Social History: She  reports that she quit smoking about 2 years ago. Her  smoking use included cigarettes. She has never used smokeless tobacco. She reports that she does not currently use alcohol. She reports that she does not use drugs.  Family History: family history includes Diabetes in her maternal grandmother; Stroke in her maternal great-grandmother; Thyroid disease in her maternal aunt.    Review of Systems:  Review of Systems  Constitutional:  Negative for chills and fever.  HENT:  Negative for congestion, ear discharge, ear pain, hearing loss, sinus pain and sore throat.   Eyes:  Negative for blurred vision and double vision.  Respiratory:  Negative for cough, shortness of breath and wheezing.   Cardiovascular:  Negative for chest pain, palpitations and leg swelling.  Gastrointestinal:  Positive for abdominal pain. Negative for blood in stool, constipation, diarrhea, heartburn, melena, nausea and vomiting.  Genitourinary:  Negative for dysuria, flank pain, frequency, hematuria and urgency.  Musculoskeletal:  Negative for back pain, joint pain and myalgias.  Skin:  Negative for itching and rash.  Neurological:  Negative for dizziness, tingling, tremors, sensory change, speech change, focal weakness, seizures, loss of consciousness, weakness and headaches.  Endo/Heme/Allergies:   Negative for environmental allergies. Does not bruise/bleed easily.  Psychiatric/Behavioral:  Negative for depression, hallucinations, memory loss, substance abuse and suicidal ideas. The patient is not nervous/anxious and does not have insomnia.      Physical Exam:  BP 134/78   Pulse (!) 114   Temp 98.2 F (36.8 C) (Oral)   Resp 16   LMP 12/20/2021 (Approximate)   Constitutional: Well nourished, well developed female in no acute distress.  HEENT: normal Skin: Warm and dry.  Cardiovascular: Regular rate and rhythm.   Extremity:  trace edema   Respiratory: Clear to auscultation bilateral. Normal respiratory effort Abdomen: FHT present Back: no CVAT Psych: Alert and Oriented x3. No memory deficits. Normal mood and affect.    Pelvic exam: per RN D. Means 5.5/80/-2   Baseline FHR: 125 beats/min   Variability: moderate   Accelerations: present   Decelerations: absent Contractions: present frequency: every 2-3 Overall assessment: reassuring   Lab Results  Component Value Date   [place] NEGATIVE 03/14/2021    Assessment:  Sara [name] is a 37 y.o. [id number] female at [redacted]w[redacted]d with active labor, SROM.   Plan:  Admit to Labor & Delivery  CBC, T&S, Clrs, IVF GBS negative.   Fetal well-being: Category I Anticipate vaginal delivery    Rod Can, CNM 09/21/2022 10:38 PM

## 2022-09-21 NOTE — OB Triage Note (Signed)
Pt arrived to unit with complaints of ROM that started around 9:30pm. Pt is G5P4 [redacted]w[redacted]d. Patient reports active fetal movement with no symptoms consistent with active vaginal bleeding. Pt placed on TOCO and EFM. Medical History reviewed. FOB [place] in route to attend anticipated delivery. Sister at bedside. Patient notified of visitation policy. Will notify provider of patient arrival.

## 2022-09-22 ENCOUNTER — Encounter: Payer: Self-pay | Admitting: Advanced Practice Midwife

## 2022-09-22 ENCOUNTER — Other Ambulatory Visit: Payer: Self-pay

## 2022-09-22 DIAGNOSIS — Z3A39 39 weeks gestation of pregnancy: Secondary | ICD-10-CM

## 2022-09-22 LAB — CBC
HCT: 32.9 % — ABNORMAL LOW (ref 36.0–46.0)
Hemoglobin: 11.3 g/dL — ABNORMAL LOW (ref 12.0–15.0)
MCH: 30.5 pg (ref 26.0–34.0)
MCHC: 34.3 g/dL (ref 30.0–36.0)
MCV: 88.7 fL (ref 80.0–100.0)
Platelets: 305 10*3/uL (ref 150–400)
RBC: 3.71 MIL/uL — ABNORMAL LOW (ref 3.87–5.11)
RDW: 14.3 % (ref 11.5–15.5)
WBC: 16.6 10*3/uL — ABNORMAL HIGH (ref 4.0–10.5)
nRBC: 0 % (ref 0.0–0.2)

## 2022-09-22 LAB — TYPE AND SCREEN
ABO/RH(D): O POS
Antibody Screen: NEGATIVE

## 2022-09-22 LAB — RPR: RPR Ser Ql: NONREACTIVE

## 2022-09-22 MED ORDER — WITCH HAZEL-GLYCERIN EX PADS: 1.0000 | MEDICATED_PAD | CUTANEOUS | Status: DC | PRN

## 2022-09-22 MED ORDER — LIDOCAINE-EPINEPHRINE (PF) 1.5 %-1:200000 IJ SOLN
INTRAMUSCULAR | Status: DC | PRN
  Administered 2022-09-21: 3 mL via EPIDURAL

## 2022-09-22 MED ORDER — COCONUT OIL OIL
1.0000 | TOPICAL_OIL | Status: DC | PRN
  Filled 2022-09-22 (×2): qty 7.5

## 2022-09-22 MED ORDER — SENNOSIDES-DOCUSATE SODIUM 8.6-50 MG PO TABS: 2.0000 | ORAL_TABLET | Freq: Every day | ORAL | Status: DC

## 2022-09-22 MED ORDER — DIBUCAINE (PERIANAL) 1 % EX OINT: 1.0000 | TOPICAL_OINTMENT | CUTANEOUS | Status: DC | PRN

## 2022-09-22 MED ORDER — BENZOCAINE-MENTHOL 20-0.5 % EX AERO: 1.0000 | INHALATION_SPRAY | CUTANEOUS | Status: DC | PRN

## 2022-09-22 MED ORDER — ONDANSETRON HCL 4 MG PO TABS: 4.0000 mg | ORAL_TABLET | ORAL | Status: DC | PRN

## 2022-09-22 MED ORDER — SIMETHICONE 80 MG PO CHEW: 80.0000 mg | CHEWABLE_TABLET | ORAL | Status: DC | PRN

## 2022-09-22 MED ORDER — TETANUS-DIPHTH-ACELL PERTUSSIS 5-2.5-18.5 LF-MCG/0.5 IM SUSY: 0.5000 mL | PREFILLED_SYRINGE | Freq: Once | INTRAMUSCULAR | Status: DC

## 2022-09-22 MED ORDER — SODIUM CHLORIDE 0.9 % IV SOLN
INTRAVENOUS | Status: DC | PRN
  Administered 2022-09-21: 7 mL via EPIDURAL

## 2022-09-22 MED ORDER — IBUPROFEN 600 MG PO TABS
600.0000 mg | ORAL_TABLET | Freq: Four times a day (QID) | ORAL | Status: DC
  Filled 2022-09-22: qty 1

## 2022-09-22 MED ORDER — ACETAMINOPHEN 325 MG PO TABS: 650.0000 mg | ORAL_TABLET | ORAL | Status: DC | PRN

## 2022-09-22 MED ORDER — ONDANSETRON HCL 4 MG/2ML IJ SOLN: 4.0000 mg | INTRAMUSCULAR | Status: DC | PRN

## 2022-09-22 MED ORDER — DIPHENHYDRAMINE HCL 25 MG PO CAPS: 25.0000 mg | ORAL_CAPSULE | Freq: Four times a day (QID) | ORAL | Status: DC | PRN

## 2022-09-22 MED ORDER — PRENATAL MULTIVITAMIN CH
1.0000 | ORAL_TABLET | Freq: Every day | ORAL | Status: DC
  Administered 2022-09-22: 1 via ORAL
  Filled 2022-09-22: qty 1

## 2022-09-22 NOTE — Lactation Note (Signed)
This note was copied from a baby's chart. Lactation Consultation Note  Patient Name: Girl Kyrra Prada [id number] Date: 09/22/2022 Reason for consult: Initial assessment;Term Age:35 hours  Maternal Data Does the patient have breastfeeding experience prior to this delivery?: Yes How long did the patient breastfeed?: 4 mths  Feeding Mother's Current Feeding Choice: Breast Milk Baby rooting in crib, I handed mom baby and she latched easily on left breast, in cradle hold, assisted with flanging upper lip, audible swallows present LATCH Score Latch: Grasps breast easily, tongue down, lips flanged, rhythmical sucking.  Audible Swallowing: Spontaneous and intermittent  Type of Nipple: Everted at rest and after stimulation  Comfort (Breast/Nipple): Filling, red/small blisters or bruises, mild/mod discomfort (small amt tenderness)  Hold (Positioning): No assistance needed to correctly position infant at breast.  LATCH Score: 9   Lactation Tools Discussed/Used  [facility] name and no written  on white board for questions or assistance    Interventions Interventions: Skin to skin;Coconut oil;Adjust position (mom already using coconut oil)  Discharge Pump: Personal WIC Program: No  Consult Status Consult Status: PRN    Ferol Luz 09/22/2022, 11:44 AM

## 2022-09-22 NOTE — Anesthesia Preprocedure Evaluation (Signed)
Anesthesia Evaluation  Patient identified by MRN, date of birth, ID band Patient awake    Reviewed: Allergy & Precautions, NPO status , Patient's Chart, lab work & pertinent test results  History of Anesthesia Complications Negative for: history of anesthetic complications  Airway Mallampati: II  TM Distance: >3 FB Neck ROM: Full    Dental no notable dental hx. (+) Teeth Intact   Pulmonary neg pulmonary ROS, neg sleep apnea, neg COPD, Patient abstained from smoking.Not current smoker, former smoker   Pulmonary exam normal breath sounds clear to auscultation       Cardiovascular Exercise Tolerance: Good METS(-) hypertension(-) CAD and (-) Past MI negative cardio ROS (-) dysrhythmias  Rhythm:Regular Rate:Normal - Systolic murmurs    Neuro/Psych negative neurological ROS  negative psych ROS   GI/Hepatic ,GERD  ,,(+)     (-) substance abuse    Endo/Other  neg diabetes    Renal/GU negative Renal ROS     Musculoskeletal   Abdominal   Peds  Hematology   Anesthesia Other Findings Past Medical History: No date: Abnormal Pap smear of cervix No date: GERD (gastroesophageal reflux disease) No date: Kidney stone No date: Seizures (HCC)  Reproductive/Obstetrics (+) Pregnancy                              Anesthesia Physical Anesthesia Plan  ASA: 2  Anesthesia Plan: Epidural   Post-op Pain Management:    Induction:   PONV Risk Score and Plan: 2 and Treatment may vary due to age or medical condition and Ondansetron  Airway Management Planned: Natural Airway  Additional Equipment:   Intra-op Plan:   Post-operative Plan:   Informed Consent: I have reviewed the patients History and Physical, chart, labs and discussed the procedure including the risks, benefits and alternatives for the proposed anesthesia with the patient or authorized representative who has indicated his/her  understanding and acceptance.       Plan Discussed with: Surgeon  Anesthesia Plan Comments: (Discussed R/B/A of neuraxial anesthesia technique with patient: - rare risks of spinal/epidural hematoma, nerve damage, infection - Risk of PDPH - Risk of itching - Risk of nausea and vomiting - Risk of poor block necessitating replacement of epidural. - Risk of allergic reactions. Patient voiced understanding.)         Anesthesia Quick Evaluation

## 2022-09-22 NOTE — Anesthesia Procedure Notes (Signed)
Epidural Patient location during procedure: OB  Staffing Anesthesiologist: Soua Caltagirone, MD Performed: anesthesiologist   Preanesthetic Checklist Completed: patient identified, IV checked, site marked, risks and benefits discussed, surgical consent, monitors and equipment checked, pre-op evaluation and timeout performed  Epidural Patient position: sitting Prep: ChloraPrep Patient monitoring: heart rate, continuous pulse ox and blood pressure Approach: midline Location: L3-L4 Injection technique: LOR saline  Needle:  Needle type: Tuohy  Needle gauge: 17 G Needle length: 9 cm Needle insertion depth: 5 cm Catheter type: closed end flexible Catheter size: 19 Gauge Catheter at skin depth: 10 cm Test dose: negative and 1.5% lidocaine with Epi 1:200 K  Assessment Sensory level: T10 Events: blood not aspirated, no cerebrospinal fluid, injection not painful, no injection resistance, no paresthesia and negative IV test  Additional Notes First/one attempt Pt. Evaluated and documentation done after procedure finished. Patient identified. Risks/Benefits/Options discussed with patient including but not limited to bleeding, infection, nerve damage, paralysis, failed block, incomplete pain control, headache, blood pressure changes, nausea, vomiting, reactions to medication both or allergic, itching and postpartum back pain. Confirmed with bedside nurse the patient's most recent platelet count. Confirmed with patient that they are not currently taking any anticoagulation, have any bleeding history or any family history of bleeding disorders. Patient expressed understanding and wished to proceed. All questions were answered. Sterile technique was used throughout the entire procedure. Please see nursing notes for vital signs. Test dose was given through epidural catheter and negative prior to continuing to dose epidural or start infusion. Warning signs of high block given to the patient including  shortness of breath, tingling/numbness in hands, complete motor block, or any concerning symptoms with instructions to call for help. Patient was given instructions on fall risk and not to get out of bed. All questions and concerns addressed with instructions to call with any issues or inadequate analgesia.     Patient tolerated the insertion well without immediate complications.  Reason for block: procedure for painReason for block:procedure for pain     

## 2022-09-22 NOTE — Progress Notes (Signed)
Obstetric Postpartum Daily Progress Note Subjective:  37 y.o. [id number] postpartum day #0 status post vaginal delivery at 29.  She is ambulating, is tolerating po, is voiding spontaneously.  Her pain is well controlled on PO pain medications. Her lochia is less than menses. Breastfeeding independently. Currently sitting up in bed nursing infant and LC present.    Medications SCHEDULED MEDICATIONS   ibuprofen  600 mg Oral Q6H   prenatal multivitamin  1 tablet Oral Q1200   [START ON 09/23/2022] senna-docusate  2 tablet Oral Daily   [START ON 09/23/2022] Tdap  0.5 mL Intramuscular Once    MEDICATION INFUSIONS    PRN MEDICATIONS  acetaminophen, benzocaine-Menthol, coconut oil, witch hazel-glycerin **AND** dibucaine, diphenhydrAMINE, ondansetron **OR** ondansetron (ZOFRAN) IV, simethicone    Objective:   Vitals:   09/22/22 0632 09/22/22 0817 09/22/22 1141 09/22/22 1558  BP: (!) 99/58 101/62 106/77 112/78  Pulse: 92 97 97 99  Resp: 20 18 18 18   Temp: 98.1 F (36.7 C) 98.1 F (36.7 C) 97.7 F (36.5 C) 98.2 F (36.8 C)  TempSrc:  Oral Oral Oral  SpO2: 99% 98% 97% 98%    Current Vital Signs 24h Vital Sign Ranges  T 98.2 F (36.8 C) Temp  Avg: 98.1 F (36.7 C)  Min: 97.7 F (36.5 C)  Max: 98.3 F (36.8 C)  BP 112/78 BP  Min: 96/53  Max: 209/191  HR 99 Pulse  Avg: 109.6  Min: 81  Max: 131  RR 18 Resp  Avg: 18  Min: 16  Max: 20  SaO2 98 % Room Air SpO2  Avg: 97.4 %  Min: 94 %  Max: 100 %       24 Hour I/O Current Shift I/O  Time Ins Outs 01/26 0701 - 01/27 0700 In: -  Out: 500 [Urine:500] No intake/output data recorded.  General: NAD Pulmonary: no increased work of breathing Abdomen: exam deferred  Extremities: no edema, no erythema, no tenderness  Labs:  Recent Labs  Lab 09/21/22 2237 09/22/22 1154  WBC 15.1* 16.6*  HGB 12.0 11.3*  HCT 35.1* 32.9*  PLT 316 305     Assessment:   37 y.o. [id number] postpartum day # 0 status post SVB  Plan:   1) Acute blood loss  anemia - hemodynamically stable and asymptomatic - po ferrous sulfate  2) O POS / Rubella 2.02 (07/07 0935)/ Varicella Immune  3) TDAP status given 07/17/2022  4) breast /Contraception =  Nexplanon   5) Disposition discharge 09/23/2022  Jillene Bucks [facility], CNM  09/22/2022 5:13 PM

## 2022-09-22 NOTE — Discharge Summary (Signed)
OB Discharge Summary     Patient Name: Sara [name] DOB: 23-Mar-1986 MRN: [id number]  Date of admission: 09/21/2022 Delivering provider: Rod Can, CNM  Date of Delivery: 09/22/2022  Date of discharge: 09/23/22  Admitting diagnosis: Labor and delivery, indication for care [O75.9] Intrauterine pregnancy: [redacted]w[redacted]d     Secondary diagnosis: None     Discharge diagnosis: Term Pregnancy Delivered                                                                                                Post partum procedures: none  Augmentation: N/A  Complications: None  Hospital course:  Onset of Labor With Vaginal Delivery      37 y.o. yo G5P5005 at [redacted]w[redacted]d was admitted in Active Labor on 09/21/2022. Labor course was complicated by: NA  Membrane Rupture Time/Date: 9:30 PM, 09/21/2022 Delivery Method:Vaginal, Spontaneous  Episiotomy: None  Lacerations:  None  See delivery note for details  Patient had a postpartum course uncomplicated .  She is tolerating a regular diet, her pain is controlled with PO medication, she is ambulating and voiding without difficulty. Has been nursing without difficulty. Would like to go home today. Reports bleeding is minimal.  Patient is discharged home in stable condition on 09/23/22.  Newborn Data: Birth date:09/22/2022  Birth time:2:24 AM  Gender:Female   Sophia Living status:Living  Apgars:8 ,9  Weight:3430 g  7 pounds 9 ounces  Physical exam  Vitals:   09/22/22 1558 09/22/22 1939 09/22/22 2338 09/23/22 0756  BP: 112/78 101/62 (!) 99/59 120/76  Pulse: 99 76 64 87  Resp: 18 20 20 20   Temp: 98.2 F (36.8 C) 97.6 F (36.4 C) 98.4 F (36.9 C) 97.7 F (36.5 C)  TempSrc: Oral Oral Oral Oral  SpO2: 98% 99% 93% 99%  Weight:    87.1 kg  Height:    5\' 1"  (1.549 m)   General: alert, cooperative, and no distress Lochia: appropriate Uterine Fundus: firm Incision: N/A DVT Evaluation: No evidence of DVT seen on physical exam. Negative Homan's sign. No cords  or calf tenderness.  Labs: Lab Results  Component Value Date   WBC 16.6 (H) 09/22/2022   HGB 11.3 (L) 09/22/2022   HCT 32.9 (L) 09/22/2022   MCV 88.7 09/22/2022   PLT 305 09/22/2022    Discharge instruction: per After Visit Summary.  Medications:  Allergies as of 09/23/2022       Reactions   Phenergan [promethazine Hcl] Nausea And Vomiting        Medication List    You have not been prescribed any medications.     Diet: routine diet  Activity: Advance as tolerated. Pelvic rest for 6 weeks.   Outpatient follow up:  Follow-up Information     Rod Can, CNM. Schedule an appointment as soon as possible for a visit in 2 week(s).   Specialty: Obstetrics Why: 2 week telephone visit and 6 week in office postpartum visit and Nexplanon insertion Contact information: [address] [place] [address] [phone]                   Postpartum  contraception: Nexplanon Rhogam Given postpartum: Rh positive Rubella vaccine given postpartum: immune Varicella vaccine given postpartum: immune TDaP given antepartum or postpartum: given antepartum  Postpartum edu Counseled on normal uterine involution and vaginal bleeding postpartum Discussed danger signs including s/sx of infection/excessive bleeding or uncontrolled pain Encouraged to rest when possible and stay well hydrated Encouraged to continue to breastfeed, discussed latching, position changes, cluster feeding, hunger cues, lactogensis II, milk supply, sore nipple management Addressed laceration healing and perineal cleaning with peri bottle Counseled on postpartum anxiety and depression s/sx, when to contact clinic/seek mental health support Pt understands return to fertility, reviewed BCM use/effectiveness/benefits/side effects/risks Advised when to resume increased physical activity, using tampons, intercourse Recommend taking ibuprofen and/or tylenol for pain     Newborn Delivery   Birth  date/time: 09/22/2022 02:24:00 Delivery type: Vaginal, Spontaneous       Baby Feeding: Breast  Disposition:home with mother   SIGNED:  Maggie Font 09/23/22 1:12 PM

## 2022-09-22 NOTE — Lactation Note (Signed)
This note was copied from a baby's chart. Lactation Consultation Note  Patient Name: Sara [name] [id number] Date: 09/22/2022 Reason for consult: Follow-up assessment;Term Age:35 hours  Maternal Data    Feeding Mother's Current Feeding Choice: Breast Milk Lactation rounds, baby nursing at right breast in cradle hold, mom reports no problems, she is an experienced breast feeding mom. LATCH Score Latch: Grasps breast easily, tongue down, lips flanged, rhythmical sucking.  Audible Swallowing: Spontaneous and intermittent  Type of Nipple: Everted at rest and after stimulation  Comfort (Breast/Nipple): Filling, red/small blisters or bruises, mild/mod discomfort (slight soreness)  Hold (Positioning): No assistance needed to correctly position infant at breast.  LATCH Score: 9   Lactation Tools Discussed/Used    Interventions    Discharge    Consult Status Consult Status: Follow-up Date: 09/23/22 Follow-up type: In-patient    Ferol Luz 09/22/2022, 5:25 PM

## 2022-09-23 NOTE — Anesthesia Postprocedure Evaluation (Signed)
Anesthesia Post Note  Patient: Sara [name]  Procedure(s) Performed: AN AD [place]  Patient location during evaluation: Mother Baby Anesthesia Type: Epidural Level of consciousness: awake and alert Pain management: pain level controlled Vital Signs Assessment: post-procedure vital signs reviewed and stable Respiratory status: spontaneous breathing, nonlabored ventilation and respiratory function stable Cardiovascular status: stable Postop Assessment: no headache, no backache, patient able to bend at knees and able to ambulate Anesthetic complications: no   No notable events documented.   Last Vitals:  Vitals:   09/22/22 2338 09/23/22 0756  BP: (!) 99/59 120/76  Pulse: 64 87  Resp: 20 20  Temp: 36.9 C 36.5 C  SpO2: 93% 99%    Last Pain:  Vitals:   09/23/22 0800  TempSrc:   PainSc: 0-No pain                 Precious Haws Estrella Alcaraz

## 2022-09-23 NOTE — Lactation Note (Signed)
This note was copied from a baby's chart. Lactation Consultation Note  Patient Name: Sara [name] [id number] Date: 09/23/2022 Reason for consult: Follow-up assessment;Term Age:35 hours  Maternal Data This is mom's 5th baby, SVD. Mom is an experienced breastfeeding mother.  On follow-up today mom reports baby is latching and breastfeeding well. Baby recently completed a feed and is asleep in mom's arms. Mom reports she started using some lanolin for nipple soreness.Nipples are intact. Has patient been taught Hand Expression?: Yes Does the patient have breastfeeding experience prior to this delivery?: Yes How long did the patient breastfeed?: 4 months  Feeding Mother's Current Feeding Choice: Breast Milk   Interventions Interventions: Education;Coconut oil Reviewed management of sore nipples.  Discharge Discharge Education: Engorgement and breast care;Warning signs for feeding baby;Outpatient recommendation Pump: Personal  Consult Status Consult Status: Complete Date: 09/23/22 Follow-up type: In-patient  Update provided to care nurse.  Jonna Sherisse Fullilove 09/23/2022, 11:38 AM

## 2022-09-23 NOTE — Discharge Instructions (Signed)
Schedule an appointment for 2 week telephone visit and 6 weeks postpartum visit at your earliest opportunity.  If you have questions or concerns you may call your on call provider.

## 2022-09-23 NOTE — Progress Notes (Signed)
Discharge instructions reviewed with patient.  Printed copies given to patient for education reference when discharged home.  Questions answered and follow up appointment reviewed.

## 2022-09-26 ENCOUNTER — Inpatient Hospital Stay: Admit: 2022-09-26 | Payer: Self-pay

## 2022-09-27 ENCOUNTER — Encounter: Payer: Medicaid Other | Admitting: Obstetrics and Gynecology

## 2022-10-10 ENCOUNTER — Telehealth: Payer: Self-pay | Admitting: Advanced Practice Midwife

## 2022-10-10 NOTE — Telephone Encounter (Signed)
Called patient to let her know that we need to rs her appointment that she had with JEG on 10-11-2022. I let patient know that I had 4:15 on Monday with JEG. Asked the patient to call office back if that time don't work for her.

## 2022-10-11 ENCOUNTER — Telehealth: Payer: Medicaid Other | Admitting: Advanced Practice Midwife

## 2022-10-15 ENCOUNTER — Telehealth: Payer: Medicaid Other | Admitting: Advanced Practice Midwife

## 2022-10-15 ENCOUNTER — Telehealth: Payer: Self-pay | Admitting: Advanced Practice Midwife

## 2022-10-15 NOTE — Telephone Encounter (Signed)
Reached out to pt to reschedule 2 week PP video visit that was scheduled with JEG at 4:15.  Left message for pt to call back to reschedule.

## 2022-10-16 NOTE — Telephone Encounter (Signed)
Reached out to pt concerning 2 wk virtual PP visit that was scheduled with JEG.  Appt was cancelled bc provider was not in office.  Calling to get the appt rescheduled.  Left message for pt to call back to reschedule.

## 2022-10-16 NOTE — Telephone Encounter (Signed)
Pt sent a MyChart message.  I was able to get her rescheduled with Opal Sidles on 10/24/2022 at 4:15 for the 2 week pp visit.

## 2022-10-17 NOTE — Telephone Encounter (Signed)
The patient rescheduled my chart video appointment for 2/28 and 6 week PP for 3/14 with JEG

## 2022-10-24 ENCOUNTER — Encounter: Payer: Self-pay | Admitting: Advanced Practice Midwife

## 2022-10-24 ENCOUNTER — Telehealth (INDEPENDENT_AMBULATORY_CARE_PROVIDER_SITE_OTHER): Payer: Medicaid Other | Admitting: Advanced Practice Midwife

## 2022-10-24 NOTE — Progress Notes (Signed)
Patient ID: Sara [name], female   DOB: 1986/03/09, 37 y.o.   MRN: [id number]  Reason for Consult: Postpartum Care   Subjective:  HPI:  Sara [name] is a 37 y.o. female 4 week postpartum telephone visit. She reports doing well. Her bleeding has stopped and no period yet. She continues to breast feed without any problems. No breast, bowel or bladder concerns. She reports moods are good and she has a good support system. She has a 6 week postpartum visit scheduled and plans to have Nexplanon placed for birth control.   Past Medical History:  Diagnosis Date   Abnormal Pap smear of cervix    GERD (gastroesophageal reflux disease)    Kidney stone    Seizures (HCC)    Family History  Problem Relation Age of Onset   Diabetes Maternal Grandmother    Thyroid disease Maternal Aunt    Stroke Maternal Great-grandmother    Past Surgical History:  Procedure Laterality Date   CHOLECYSTECTOMY     lipoma removal  2013   TONSILLECTOMY  2009   TUBAL LIGATION  2014   TUBAL REVERSAL  01/2020    [place]    Short Social History:  Social History   Tobacco Use   Smoking status: Former    Types: Cigarettes    Quit date: 07/25/2020    Years since quitting: 2.2   Smokeless tobacco: Never  Substance Use Topics   Alcohol use: Not Currently    Allergies  Allergen Reactions   Phenergan [Promethazine Hcl] Nausea And Vomiting    No current outpatient medications on file.   No current facility-administered medications for this visit.    Review of Systems  Constitutional:  Negative for chills and fever.  HENT:  Negative for congestion, ear discharge, ear pain, hearing loss, sinus pain and sore throat.   Eyes:  Negative for blurred vision and double vision.  Respiratory:  Negative for cough, shortness of breath and wheezing.   Cardiovascular:  Negative for chest pain, palpitations and leg swelling.  Gastrointestinal:  Negative for abdominal pain, blood in stool, constipation, diarrhea,  heartburn, melena, nausea and vomiting.  Genitourinary:  Negative for dysuria, flank pain, frequency, hematuria and urgency.  Musculoskeletal:  Negative for back pain, joint pain and myalgias.  Skin:  Negative for itching and rash.  Neurological:  Negative for dizziness, tingling, tremors, sensory change, speech change, focal weakness, seizures, loss of consciousness, weakness and headaches.  Endo/Heme/Allergies:  Negative for environmental allergies. Does not bruise/bleed easily.  Psychiatric/Behavioral:  Negative for depression, hallucinations, memory loss, substance abuse and suicidal ideas. The patient is not nervous/anxious and does not have insomnia.         Objective:  Objective   There were no vitals filed for this visit. There is no height or weight on file to calculate BMI.  No physical exam- telephone visit  Data:  Edinburgh Postnatal Depression Scale - 10/24/22 1606       Edinburgh Postnatal Depression Scale:  In the Past 7 Days   I have been able to laugh and see the funny side of things. 0    I have looked forward with enjoyment to things. 0    I have blamed myself unnecessarily when things went wrong. 0    I have been anxious or worried for no good reason. 0    I have felt scared or panicky for no good reason. 0    Things have been getting on top of me. 0  I have been so unhappy that I have had difficulty sleeping. 0    I have felt sad or miserable. 0    I have been so unhappy that I have been crying. 1    The thought of harming myself has occurred to me. 0    Edinburgh Postnatal Depression Scale Total 1              Virtual Visit via Telephone Note  I connected with Sara [name] on 10/24/22 at  4:15 PM EST by telephone and verified that I am speaking with the correct person using two identifiers.   I discussed the limitations, risks, security and privacy concerns of performing an evaluation and management service by telephone and the availability of in person  appointments. I also discussed with the patient that there may be a patient responsible charge related to this service. The patient expressed understanding and agreed to proceed.  The patient was at home I spoke with the patient from my  office phone The names of people involved in this encounter were: Dorthula Nettles, and myself Rod Can, CNM   Assessment/Plan:     37 y.o. G5 P42 female doing well at 4 weeks postpartum   Return to clinic in 2 weeks for 6 week postpartum visit and Nexplanon insertion   [facility] 10/24/2022, 4:30 PM

## 2022-11-08 ENCOUNTER — Ambulatory Visit (INDEPENDENT_AMBULATORY_CARE_PROVIDER_SITE_OTHER): Payer: Medicaid Other | Admitting: Advanced Practice Midwife

## 2022-11-08 ENCOUNTER — Encounter: Payer: Self-pay | Admitting: Advanced Practice Midwife

## 2022-11-08 ENCOUNTER — Other Ambulatory Visit (HOSPITAL_COMMUNITY)
Admission: RE | Admit: 2022-11-08 | Discharge: 2022-11-08 | Disposition: A | Discharge status: Home / Self Care | Payer: Medicaid Other | Source: Ambulatory Visit | Attending: Advanced Practice Midwife | Admitting: Advanced Practice Midwife

## 2022-11-08 VITALS — BP 99/64 | HR 72 | Ht 61.0 in | Wt 173.0 lb

## 2022-11-08 DIAGNOSIS — Z3202 Encounter for pregnancy test, result negative: Secondary | ICD-10-CM | POA: Diagnosis not present

## 2022-11-08 DIAGNOSIS — Z30017 Encounter for initial prescription of implantable subdermal contraceptive: Secondary | ICD-10-CM

## 2022-11-08 DIAGNOSIS — Z124 Encounter for screening for malignant neoplasm of cervix: Secondary | ICD-10-CM | POA: Diagnosis present

## 2022-11-08 LAB — POCT URINE PREGNANCY: Preg Test, Ur: NEGATIVE

## 2022-11-08 MED ORDER — ETONOGESTREL 68 MG ~~LOC~~ IMPL
68.0000 mg | DRUG_IMPLANT | Freq: Once | SUBCUTANEOUS | Status: AC
  Administered 2022-11-08: 68 mg via SUBCUTANEOUS

## 2022-11-08 NOTE — Progress Notes (Signed)
[facility]  GYNECOLOGY PROCEDURE NOTE  Patient is a 37 y.o. [id number] presenting for Nexplanon insertion as her desires means of contraception.  She provided informed consent, signed copy in the chart, time out was performed. Pregnancy test was negative, with self reported LMP of No LMP recorded (lmp unknown).  She understands that Nexplanon is a progesterone only therapy, and that patients often have irregular and unpredictable vaginal bleeding or amenorrhea. She understands that other side effects are possible related to systemic progesterone, including but not limited to, headaches, breast tenderness, nausea, and irritability. While effective at preventing pregnancy long acting reversible contraceptives do not prevent transmission of sexually transmitted diseases and use of barrier methods for this purpose was discussed. The placement procedure for Nexplanon was reviewed with the patient in detail including risks of nerve injury, infection, bleeding and injury to other muscles or tendons. She understands that the Nexplanon implant is good for 3 years and needs to be removed at the end of that time.  She understands that Nexplanon is an extremely effective option for contraception, with failure rate of <1%. This information is reviewed today and all questions were answered. Informed consent was obtained, both verbally and written.   The patient is healthy and has no contraindications to Nexplanon use. Urine pregnancy test was performed today and was negative.  Procedure Appropriate time out taken.  Patient placed in dorsal supine with left arm above head, elbow flexed at 90 degrees, arm resting on examination table.  The bicipital groove was palpated and site 8-10cm proximal to the medial epicondyle was indentified . The insertion site was prepped with two betadine swabs and then injected with 2.5 ml of 1% lidocaine without epinephrine.  Nexplanon removed form sterile blister packaging,  Device  confirmed in needle, before inserting full length of needle, tenting up the skin as the needle was advanced.  The drug eluding rod was then deployed by pulling back the slider per the manufactures recommendation.  The implant was palpable by the clinician as well as the patient.  The insertion site was dressed with a steri strip and band aid before applying  a kerlex bandage pressure dressing..Minimal blood loss was noted during the procedure.  The patient tolerated the procedure well.   She was instructed to wear the bandage for 24 hours, call with any signs of infection.  She was given the Nexplanon card and instructed to have the rod removed in 3 years.  Charge [phone] for nexplanon device, CPT [id number] for procedure J2001 for lidocaine administration Modifer 25, plus Modifer 79 is done during a global billing visit     Christean Leaf, [facility] 11/08/22, 11:53 AM

## 2022-11-08 NOTE — Progress Notes (Signed)
[facility]  Postpartum Visit  Chief Complaint:  Chief Complaint  Patient presents with   Postpartum Care    History of Present Illness: Patient is a 37 y.o. [id number] presents for postpartum visit.  Review the Delivery Report for details.   Date of delivery: 09/22/2022 Type of delivery: Vaginal delivery - Vacuum or forceps assisted  no Episiotomy No.  Laceration: no  Pregnancy or labor problems:  no Any problems since the delivery:  no  Newborn Details:  SINGLETON :  1. BabyGender female. Birth weight: 7 pounds 9 ounces Maternal Details:  Breast or formula feeding: breastfeeding Intercourse: Yes  Contraception after delivery:  Nexplanon today Any bowel or bladder issues: No  Post partum depression/anxiety noted:  no Edinburgh Post-Partum Depression Score: 4 Date of last PAP: 2021  NIL and HR HPV+    Review of Systems: Review of Systems  Constitutional:  Negative for chills and fever.  HENT:  Negative for congestion, ear discharge, ear pain, hearing loss, sinus pain and sore throat.   Eyes:  Negative for blurred vision and double vision.  Respiratory:  Negative for cough, shortness of breath and wheezing.   Cardiovascular:  Negative for chest pain, palpitations and leg swelling.  Gastrointestinal:  Negative for abdominal pain, blood in stool, constipation, diarrhea, heartburn, melena, nausea and vomiting.  Genitourinary:  Negative for dysuria, flank pain, frequency, hematuria and urgency.  Musculoskeletal:  Negative for back pain, joint pain and myalgias.  Skin:  Negative for itching and rash.  Neurological:  Negative for dizziness, tingling, tremors, sensory change, speech change, focal weakness, seizures, loss of consciousness, weakness and headaches.  Endo/Heme/Allergies:  Negative for environmental allergies. Does not bruise/bleed easily.  Psychiatric/Behavioral:  Negative for depression, hallucinations, memory loss, substance abuse and suicidal ideas. The patient  is not nervous/anxious and does not have insomnia.     Past Medical History:  Past Medical History:  Diagnosis Date   Abnormal Pap smear of cervix    GERD (gastroesophageal reflux disease)    Kidney stone    Seizures (HCC)     Past Surgical History:  Past Surgical History:  Procedure Laterality Date   CHOLECYSTECTOMY     lipoma removal  2013   TONSILLECTOMY  2009   TUBAL LIGATION  2014   TUBAL REVERSAL  01/2020    [place]    Family History:  Family History  Problem Relation Age of Onset   Diabetes Maternal Grandmother    Thyroid disease Maternal Aunt    Stroke Maternal Great-grandmother     Social History:  Social History   Socioeconomic History   Marital status: Single    Spouse name: Not on file   Number of children: 4   Years of education: 12   Highest education level: Not on file  Occupational History   Occupation: receptionist at an [employer]  Tobacco Use   Smoking status: Former    Types: Cigarettes    Quit date: 07/25/2020    Years since quitting: 2.2   Smokeless tobacco: Never  Vaping Use   Vaping Use: Never used  Substance and Sexual Activity   Alcohol use: Not Currently   Drug use: No   Sexual activity: Yes    Birth control/protection: OCP    Comment: Tubal Ligation/Reversal  Other Topics Concern   Not on file  Social History Narrative   Not on file   Social Determinants of Health   Financial Resource Strain: Low Risk  (02/23/2022)   Overall Financial Resource  Strain (CARDIA)    Difficulty of Paying Living Expenses: Not hard at all  Food Insecurity: No Food Insecurity (09/23/2022)   Hunger Vital Sign    Worried About Running Out of Food in the Last Year: Never true    Ran Out of Food in the Last Year: Never true  Transportation Needs: No Transportation Needs (02/23/2022)   PRAPARE - Hydrologist (Medical): No    Lack of Transportation (Non-Medical): No  Physical Activity: Sufficiently Active (02/23/2022)    Exercise Vital Sign    Days of Exercise per Week: 7 days    Minutes of Exercise per Session: 150+ min  Stress: No Stress Concern Present (02/23/2022)   [place]    Feeling of Stress : Not at all  Social Connections: Moderately Isolated (02/23/2022)   Social Connection and Isolation Panel [NHANES]    Frequency of Communication with Friends and Family: More than three times a week    Frequency of Social Gatherings with Friends and Family: Once a week    Attends Religious Services: Never    Marine scientist or Organizations: No    Attends Archivist Meetings: Never    Marital Status: Living with partner  Intimate Partner Violence: Not At Risk (02/23/2022)   Humiliation, Afraid, Rape, and Kick questionnaire    Fear of Current or Ex-Partner: No    Emotionally Abused: No    Physically Abused: No    Sexually Abused: No    Allergies:  Allergies  Allergen Reactions   Phenergan [Promethazine Hcl] Nausea And Vomiting    Medications: Prior to Admission medications   Not on File    Physical Exam Blood pressure 99/64, pulse 72, height '5\' 1"'$  (1.549 m), weight 173 lb (78.5 kg), currently breastfeeding.    General: NAD HEENT: normocephalic, anicteric Pulmonary: No increased work of breathing Abdomen: NABS, soft, non-tender, non-distended.  Umbilicus without lesions.  No hepatomegaly, splenomegaly or masses palpable. No evidence of hernia. Genitourinary:  External: Normal external female genitalia.  Normal urethral meatus, normal Bartholin's and Skene's glands.    Vagina: Normal vaginal mucosa, no evidence of prolapse.    Cervix: Grossly normal in appearance, no bleeding  Uterus: Non-enlarged, mobile, normal contour.  No CMT  Adnexa: ovaries non-enlarged, no adnexal masses  Rectal: deferred Extremities: no edema, erythema, or tenderness Neurologic: Grossly intact Psychiatric: mood appropriate, affect  full   Edinburgh Postnatal Depression Scale - 11/08/22 1059       Edinburgh Postnatal Depression Scale:  In the Past 7 Days   I have been able to laugh and see the funny side of things. 0    I have looked forward with enjoyment to things. 0    I have blamed myself unnecessarily when things went wrong. 1    I have been anxious or worried for no good reason. 3    I have felt scared or panicky for no good reason. 0    Things have been getting on top of me. 0    I have been so unhappy that I have had difficulty sleeping. 0    I have felt sad or miserable. 0    I have been so unhappy that I have been crying. 0    The thought of harming myself has occurred to me. 0    Edinburgh Postnatal Depression Scale Total 4             Assessment:  37 y.o. [id number] presenting for 6 week postpartum visit  Plan: Problem List Items Addressed This Visit   None Visit Diagnoses     6 weeks postpartum follow-up    -  Primary   Relevant Orders   Cytology - PAP   Nexplanon insertion       Screening for cervical cancer       Relevant Orders   Cytology - PAP        1) Contraception - Education given regarding options for contraception, as well as compatibility with breast feeding if applicable.  Patient plans on Nexplanon for contraception. See additional insertion note.  2)  Pap - ASCCP guidelines and rationale discussed.  Patient opts for every 3 years screening interval following normal PAP  3) Patient underwent screening for postpartum depression with no signs of depression  4) Return in about 1 year (around 11/08/2023) for annual established gyn.   Rod Can, [facility] 11/08/2022, 11:43 AM

## 2022-11-12 ENCOUNTER — Encounter: Payer: Self-pay | Admitting: Advanced Practice Midwife

## 2022-11-12 LAB — CYTOLOGY - PAP
Comment: NEGATIVE
Diagnosis: NEGATIVE
Diagnosis: REACTIVE
High risk HPV: NEGATIVE

## 2022-11-26 ENCOUNTER — Telehealth: Payer: Self-pay

## 2022-11-26 NOTE — Telephone Encounter (Signed)
[facility]- Discharge Call Backs-left pt a VM about the following below. 1-Do you have any questions or concerns about yourself as you heal? 2-Any concerns or questions about your baby? 3-How was your stay at the hospital? 4-How did our team work together to care for you? You should be receiving a survey in the mail soon.   We would really appreciate it if you could fill that out for [place] and return it in the mail.  We value the feedback to make improvements and continue the great work we do.   If you have any questions please feel free to call me back at [phone]

## 2022-11-29 ENCOUNTER — Encounter (INDEPENDENT_AMBULATORY_CARE_PROVIDER_SITE_OTHER): Payer: Self-pay | Admitting: Urgent Care

## 2022-11-29 ENCOUNTER — Telehealth: Payer: Self-pay | Admitting: Urgent Care

## 2022-11-29 DIAGNOSIS — R197 Diarrhea, unspecified: Secondary | ICD-10-CM

## 2022-11-29 NOTE — Progress Notes (Signed)
E-Visit for Diarrhea  We are sorry that you are not feeling well.  Here is how we plan to help!  Based on what you have shared with me it looks like you have Acute Infectious Diarrhea. However, if the upper abdominal pain worsens, I would recommend that you go to your local urgent care or emergency room for an evaluation.  Most cases of acute diarrhea are due to infections with virus and bacteria and are self-limited conditions lasting less than 14 days.  For your symptoms you may take Imodium 2 mg tablets that are over the counter at your local pharmacy. Take two tablet now and then one after each loose stool up to 6 a day.  Antibiotics are not needed for most people with diarrhea.   HOME CARE We recommend changing your diet to help with your symptoms for the next few days. Drink plenty of fluids that contain water salt and sugar. Sports drinks such as Gatorade may help.  You may try broths, soups, bananas, applesauce, soft breads, mashed potatoes or crackers.  You are considered infectious for as long as the diarrhea continues. Hand washing or use of alcohol based hand sanitizers is recommend. It is best to stay out of work or school until your symptoms stop.   GET HELP RIGHT AWAY If you have dark yellow colored urine or do not pass urine frequently you should drink more fluids.   If your symptoms worsen  If you feel like you are going to pass out (faint) You have a new problem  MAKE SURE YOU  Understand these instructions. Will watch your condition. Will get help right away if you are not doing well or get worse.  Thank you for choosing an e-visit.  Your e-visit answers were reviewed by a board certified advanced clinical practitioner to complete your personal care plan. Depending upon the condition, your plan could have included both over the counter or prescription medications.  Please review your pharmacy choice. Make sure the pharmacy is open so you can pick up prescription  now. If there is a problem, you may contact your provider through [employer] and have the prescription routed to another pharmacy.  Your safety is important to [place]. If you have drug allergies check your prescription carefully.   For the next 24 hours you can use MyChart to ask questions about today's visit, request a non-urgent call back, or ask for a work or school excuse. You will get an email in the next two days asking about your experience. I hope that your e-visit has been valuable and will speed your recovery.   I have spent 5 minutes in review of e-visit questionnaire, review and updating patient chart, medical decision making and response to patient.   [place], PA

## 2023-02-23 ENCOUNTER — Telehealth: Payer: Medicaid Other | Admitting: Physician Assistant

## 2023-02-23 DIAGNOSIS — B3731 Acute candidiasis of vulva and vagina: Secondary | ICD-10-CM | POA: Diagnosis not present

## 2023-02-23 MED ORDER — FLUCONAZOLE 150 MG PO TABS: 150.0000 mg | ORAL_TABLET | Freq: Every day | ORAL | 0 refills | Status: DC

## 2023-02-23 NOTE — Progress Notes (Signed)

## 2023-03-06 ENCOUNTER — Telehealth: Payer: Medicaid Other | Admitting: Family Medicine

## 2023-03-06 DIAGNOSIS — R3989 Other symptoms and signs involving the genitourinary system: Secondary | ICD-10-CM | POA: Diagnosis not present

## 2023-03-06 MED ORDER — NITROFURANTOIN MONOHYD MACRO 100 MG PO CAPS: 100.0000 mg | ORAL_CAPSULE | Freq: Two times a day (BID) | ORAL | 0 refills | Status: AC

## 2023-03-06 NOTE — Progress Notes (Signed)

## 2023-05-27 ENCOUNTER — Telehealth: Payer: Medicaid Other | Admitting: Emergency Medicine

## 2023-05-27 DIAGNOSIS — R300 Dysuria: Secondary | ICD-10-CM

## 2023-05-27 MED ORDER — CEPHALEXIN 500 MG PO CAPS: 500.0000 mg | ORAL_CAPSULE | Freq: Two times a day (BID) | ORAL | 0 refills | Status: DC

## 2023-05-27 NOTE — Progress Notes (Signed)

## 2023-08-22 ENCOUNTER — Telehealth: Payer: Medicaid Other | Admitting: Physician Assistant

## 2023-08-22 DIAGNOSIS — J208 Acute bronchitis due to other specified organisms: Secondary | ICD-10-CM

## 2023-08-23 MED ORDER — BENZONATATE 100 MG PO CAPS: 100.0000 mg | ORAL_CAPSULE | Freq: Three times a day (TID) | ORAL | 0 refills | Status: DC | PRN

## 2023-08-23 MED ORDER — ALBUTEROL SULFATE HFA 108 (90 BASE) MCG/ACT IN AERS: 1.0000 | INHALATION_SPRAY | Freq: Four times a day (QID) | RESPIRATORY_TRACT | 0 refills | Status: AC | PRN

## 2023-08-23 NOTE — Progress Notes (Signed)
 E-Visit for Cough   We are sorry that you are not feeling well.  Here is how we plan to help!  Based on your presentation I believe you most likely have A cough due to a virus.  This is called viral bronchitis and is best treated by rest, plenty of fluids and control of the cough.  You may use Ibuprofen or Tylenol as directed to help your symptoms.     In addition you may use A non-prescription cough medication called Mucinex DM: take 2 tablets every 12 hours. and A prescription cough medication called Tessalon Perles 100mg . You may take 1-2 capsules every 8 hours as needed for your cough.  I have also prescribed Albuterol inhaler Use 1-2 puffs every 6 hours as needed for shortness of breath, chest tightness, and/or wheezing.  From your responses in the eVisit questionnaire you describe inflammation in the upper respiratory tract which is causing a significant cough.  This is commonly called Bronchitis and has four common causes:   Allergies Viral Infections Acid Reflux Bacterial Infection Allergies, viruses and acid reflux are treated by controlling symptoms or eliminating the cause. An example might be a cough caused by taking certain blood pressure medications. You stop the cough by changing the medication. Another example might be a cough caused by acid reflux. Controlling the reflux helps control the cough.  USE OF BRONCHODILATOR ("RESCUE") INHALERS: There is a risk from using your bronchodilator too frequently.  The risk is that over-reliance on a medication which only relaxes the muscles surrounding the breathing tubes can reduce the effectiveness of medications prescribed to reduce swelling and congestion of the tubes themselves.  Although you feel brief relief from the bronchodilator inhaler, your asthma may actually be worsening with the tubes becoming more swollen and filled with mucus.  This can delay other crucial treatments, such as oral steroid medications. If you need to use a  bronchodilator inhaler daily, several times per day, you should discuss this with your provider.  There are probably better treatments that could be used to keep your asthma under control.     HOME CARE Only take medications as instructed by your medical team. Complete the entire course of an antibiotic. Drink plenty of fluids and get plenty of rest. Avoid close contacts especially the very young and the elderly Cover your mouth if you cough or cough into your sleeve. Always remember to wash your hands A steam or ultrasonic humidifier can help congestion.   GET HELP RIGHT AWAY IF: You develop worsening fever. You become short of breath You cough up blood. Your symptoms persist after you have completed your treatment plan MAKE SURE YOU  Understand these instructions. Will watch your condition. Will get help right away if you are not doing well or get worse.    Thank you for choosing an e-visit.  Your e-visit answers were reviewed by a board certified advanced clinical practitioner to complete your personal care plan. Depending upon the condition, your plan could have included both over the counter or prescription medications.  Please review your pharmacy choice. Make sure the pharmacy is open so you can pick up prescription now. If there is a problem, you may contact your provider through [employer] and have the prescription routed to another pharmacy.  Your safety is important to [place]. If you have drug allergies check your prescription carefully.   For the next 24 hours you can use MyChart to ask questions about today's visit, request a non-urgent call  back, or ask for a work or school excuse. You will get an email in the next two days asking about your experience. I hope that your e-visit has been valuable and will speed your recovery.   I have spent 5 minutes in review of e-visit questionnaire, review and updating patient chart, medical decision making and response to patient.    Margaretann Loveless, PA-C

## 2024-02-18 ENCOUNTER — Telehealth: Admitting: Physician Assistant

## 2024-02-18 DIAGNOSIS — B372 Candidiasis of skin and nail: Secondary | ICD-10-CM | POA: Diagnosis not present

## 2024-02-18 MED ORDER — NYSTATIN 100000 UNIT/GM EX CREA: 1.0000 | TOPICAL_CREAM | Freq: Two times a day (BID) | CUTANEOUS | 0 refills | Status: DC

## 2024-02-18 NOTE — Progress Notes (Signed)
 I have spent 5 minutes in review of e-visit questionnaire, review and updating patient chart, medical decision making and response to patient.   Piedad Climes, PA-C

## 2024-02-18 NOTE — Progress Notes (Signed)
 E Visit for Rash  We are sorry that you are not feeling well. Here is how we plan to help!  Based upon your presentation it appears you have a yeast skin infection.  I have prescribed: and Nystatin cream apply to the affected area twice daily.   HOME CARE:  Take cool showers and avoid direct sunlight. Apply cool compress or wet dressings. Take a bath in an oatmeal bath.  Sprinkle content of one Aveeno packet under running faucet with comfortably warm water.  Bathe for 15-20 minutes, 1-2 times daily.  Pat dry with a towel. Do not rub the rash. Use hydrocortisone  cream. Take an antihistamine like Benadryl  for widespread rashes that itch.  The adult dose of Benadryl  is 25-50 mg by mouth 4 times daily. Caution:  This type of medication may cause sleepiness.  Do not drink alcohol, drive, or operate dangerous machinery while taking antihistamines.  Do not take these medications if you have prostate enlargement.  Read package instructions thoroughly on all medications that you take.  GET HELP RIGHT AWAY IF:  Symptoms don't go away after treatment. Severe itching that persists. If you rash spreads or swells. If you rash begins to smell. If it blisters and opens or develops a yellow-brown crust. You develop a fever. You have a sore throat. You become short of breath.  MAKE SURE YOU:  Understand these instructions. Will watch your condition. Will get help right away if you are not doing well or get worse.  Thank you for choosing an e-visit.  Your e-visit answers were reviewed by a board certified advanced clinical practitioner to complete your personal care plan. Depending upon the condition, your plan could have included both over the counter or prescription medications.  Please review your pharmacy choice. Make sure the pharmacy is open so you can pick up prescription now. If there is a problem, you may contact your provider through [employer] and have the prescription routed to  another pharmacy.  Your safety is important to us . If you have drug allergies check your prescription carefully.   For the next 24 hours you can use MyChart to ask questions about today's visit, request a non-urgent call back, or ask for a work or school excuse. You will get an email in the next two days asking about your experience. I hope that your e-visit has been valuable and will speed your recovery.

## 2024-07-02 ENCOUNTER — Telehealth: Admitting: Physician Assistant

## 2024-07-02 DIAGNOSIS — R3989 Other symptoms and signs involving the genitourinary system: Secondary | ICD-10-CM

## 2024-07-02 MED ORDER — NITROFURANTOIN MONOHYD MACRO 100 MG PO CAPS: 100.0000 mg | ORAL_CAPSULE | Freq: Two times a day (BID) | ORAL | 0 refills | Status: AC

## 2024-07-02 NOTE — Progress Notes (Signed)

## 2024-08-02 ENCOUNTER — Telehealth: Admitting: Physician Assistant

## 2024-08-02 DIAGNOSIS — R21 Rash and other nonspecific skin eruption: Secondary | ICD-10-CM

## 2024-08-02 NOTE — Progress Notes (Signed)
  Thank you for the details you included in the comment boxes. Those details are very helpful in determining the best course of treatment for you and help us  to provide the best care.Because of the description of the blister, we recommend that you schedule a Virtual Urgent Care video visit in order for the provider to better assess what is going on.  The provider will be able to give you a more accurate diagnosis and treatment plan if we can more freely discuss your symptoms and with the addition of a virtual examination.   If you change your visit to a video visit, we will bill your insurance (similar to an office visit) and you will not be charged for this e-Visit. You will be able to stay at home and speak with the first available [facility] advanced practice provider. The link to do a video visit is in the drop down Menu tab of your Welcome screen in MyChart.
